# Patient Record
Sex: Female | Born: 1960
Health system: Southern US, Community
[De-identification: ages and names within clinical notes are randomized; demographics above are authoritative.]

## PROBLEM LIST (undated history)

## (undated) DIAGNOSIS — K649 Unspecified hemorrhoids: Secondary | ICD-10-CM

## (undated) DIAGNOSIS — M1990 Unspecified osteoarthritis, unspecified site: Secondary | ICD-10-CM

## (undated) HISTORY — DX: Unspecified osteoarthritis, unspecified site: M19.90

## (undated) HISTORY — DX: Unspecified hemorrhoids: K64.9

## (undated) HISTORY — PX: BREAST BIOPSY: SHX20

---

## 1997-09-14 ENCOUNTER — Other Ambulatory Visit: Admission: RE | Admit: 1997-09-14 | Discharge: 1997-09-14 | Payer: Self-pay | Admitting: Obstetrics & Gynecology

## 1997-11-10 ENCOUNTER — Ambulatory Visit (HOSPITAL_COMMUNITY): Admission: RE | Admit: 1997-11-10 | Discharge: 1997-11-10 | Payer: Self-pay | Admitting: *Deleted

## 1998-03-31 ENCOUNTER — Inpatient Hospital Stay (HOSPITAL_COMMUNITY): Admission: AD | Admit: 1998-03-31 | Discharge: 1998-04-02 | Payer: Self-pay | Admitting: *Deleted

## 2001-08-05 ENCOUNTER — Encounter: Payer: Self-pay | Admitting: Family Medicine

## 2001-08-05 ENCOUNTER — Encounter: Admission: RE | Admit: 2001-08-05 | Discharge: 2001-08-05 | Payer: Self-pay | Admitting: Family Medicine

## 2004-04-13 ENCOUNTER — Other Ambulatory Visit: Admission: RE | Admit: 2004-04-13 | Discharge: 2004-04-13 | Payer: Self-pay | Admitting: Obstetrics and Gynecology

## 2004-04-14 ENCOUNTER — Ambulatory Visit (HOSPITAL_COMMUNITY): Admission: RE | Admit: 2004-04-14 | Discharge: 2004-04-14 | Payer: Self-pay | Admitting: Obstetrics and Gynecology

## 2004-04-25 ENCOUNTER — Encounter: Admission: RE | Admit: 2004-04-25 | Discharge: 2004-04-25 | Payer: Self-pay | Admitting: Obstetrics and Gynecology

## 2006-02-13 HISTORY — PX: OTHER SURGICAL HISTORY: SHX169

## 2006-12-24 ENCOUNTER — Encounter: Admission: RE | Admit: 2006-12-24 | Discharge: 2006-12-24 | Payer: Self-pay | Admitting: Obstetrics and Gynecology

## 2007-01-01 ENCOUNTER — Encounter: Admission: RE | Admit: 2007-01-01 | Discharge: 2007-01-01 | Payer: Self-pay | Admitting: Obstetrics and Gynecology

## 2007-01-09 ENCOUNTER — Encounter (INDEPENDENT_AMBULATORY_CARE_PROVIDER_SITE_OTHER): Payer: Self-pay | Admitting: Diagnostic Radiology

## 2007-01-09 ENCOUNTER — Encounter: Admission: RE | Admit: 2007-01-09 | Discharge: 2007-01-09 | Payer: Self-pay | Admitting: Obstetrics and Gynecology

## 2007-11-29 ENCOUNTER — Ambulatory Visit: Payer: Self-pay | Admitting: Internal Medicine

## 2007-12-11 ENCOUNTER — Ambulatory Visit: Payer: Self-pay | Admitting: Internal Medicine

## 2007-12-24 ENCOUNTER — Telehealth: Payer: Self-pay | Admitting: Internal Medicine

## 2007-12-24 LAB — CONVERTED CEMR LAB
Cholesterol: 160 mg/dL (ref 0–200)
LDL Cholesterol: 97 mg/dL (ref 0–99)
Total CHOL/HDL Ratio: 2.8
VLDL: 6 mg/dL (ref 0–40)

## 2007-12-27 ENCOUNTER — Encounter: Admission: RE | Admit: 2007-12-27 | Discharge: 2007-12-27 | Payer: Self-pay | Admitting: Obstetrics and Gynecology

## 2008-01-13 ENCOUNTER — Encounter: Admission: RE | Admit: 2008-01-13 | Discharge: 2008-01-13 | Payer: Self-pay | Admitting: Obstetrics and Gynecology

## 2008-10-20 ENCOUNTER — Encounter: Admission: RE | Admit: 2008-10-20 | Discharge: 2008-10-20 | Payer: Self-pay | Admitting: Obstetrics and Gynecology

## 2009-12-06 ENCOUNTER — Encounter: Payer: Self-pay | Admitting: Internal Medicine

## 2009-12-06 ENCOUNTER — Ambulatory Visit: Payer: Self-pay | Admitting: Family Medicine

## 2009-12-06 DIAGNOSIS — K648 Other hemorrhoids: Secondary | ICD-10-CM | POA: Insufficient documentation

## 2010-03-15 NOTE — Assessment & Plan Note (Signed)
Summary: external hemorroid//ccm   Vital Signs:  Patient profile:   50 year old female Height:      64 inches (162.56 cm) Weight:      131 pounds (59.55 kg) BMI:     22.57 O2 Sat:      98 % on Room air Temp:     98.5 degrees F (36.94 degrees C) oral Pulse rate:   90 / minute BP sitting:   120 / 78  (left arm) Cuff size:   regular  Vitals Entered By: Josph Macho RMA (December 06, 2009 2:43 PM)  O2 Flow:  Room air CC: External hemorroid/ painful X5 days/  CF Is Patient Diabetic? No   History of Present Illness: Patient is having trouble with an external hemorrhoid. Has had trouble off and on over the past year, has occasional scant rectal bleeding red on the tissue, resolves spontaneously and nonpainful. This time the hemorrhoid is external and nonreducible. Mildly painful, unable to sit comfortably. Is still moving her bowels daily, no diarrhea, no tarry stool. No abdominal pain/v/n/anorexia/f/c/CP/palp/SOB.  Current Problems (verified): 1)  Preventive Health Care  (ICD-V70.0)  Current Medications (verified): 1)  None  Allergies (verified): No Known Drug Allergies  Past History:  Past medical history reviewed for relevance to current acute and chronic problems. Social history (including risk factors) reviewed for relevance to current acute and chronic problems.  Past Medical History: Reviewed history from 11/29/2007 and no changes required. gravida 5, para 5 abortus zero  Social History: Reviewed history from 11/29/2007 and no changes required. Married Never Smoked  5 children, ages 64, through 20  Review of Systems      See HPI  Physical Exam  General:  Well-developed,well-nourished,in no acute distress; alert,appropriate and cooperative throughout examination Head:  Normocephalic and atraumatic without obvious abnormalities. No apparent alopecia or balding. Ears:  External ear exam shows no significant lesions or deformities.  Otoscopic examination reveals  clear canals, tympanic membranes are intact bilaterally without bulging, retraction, inflammation or discharge. Hearing is grossly normal bilaterally. Nose:  External nasal examination shows no deformity or inflammation. Nasal mucosa are pink and moist without lesions or exudates. Neck:  No deformities, masses, or tenderness noted. Lungs:  Normal respiratory effort, chest expands symmetrically. Lungs are clear to auscultation, no crackles or wheezes. Heart:  Normal rate and regular rhythm. S1 and S2 normal without gallop, murmur, click, rub or other extra sounds. Abdomen:  Bowel sounds positive,abdomen soft and non-tender without masses, organomegaly or hernias noted. Extremities:  No clubbing, cyanosis, edema, or deformity noted  Cervical Nodes:  No lymphadenopathy noted Psych:  Oriented X3 and dysphoric affect.     Impression & Recommendations:  Problem # 1:  INTERNAL HEMORRHOIDS WITHOUT MENTION COMP (ICD-455.0) Anusol HC suppository two times a day and referred to GI for colonoscopy due to her age and recurrence of hermorrhoids, Encouraged 64 oz of clear fluids and hi fiber diet. Suggested daily yogurt/probiotic  Complete Medication List: 1)  Anusol-hc 25 Mg Supp (Hydrocortisone acetate) .Marland Kitchen.. 1 supp pr two times a day x 5 days then as needed two times a day for hemorrhoids after that 2)  Lidocaine Hcl 2 % Gel (Lidocaine hcl) .... Apply small amount pr two times a day as needed pain  Other Orders: Gastroenterology Referral (GI)  Patient Instructions: 1)  Please schedule a follow-up appointment as needed if symptoms worsen or do not improve. 2)  Cleanse area with witch hazel astringent or Tucks medicated pads as needed bowel movements  3)  Use the Lidocaine sparingly for pain Prescriptions: LIDOCAINE HCL 2 % GEL (LIDOCAINE HCL) apply small amount pr two times a day as needed pain  #1 tube x 1   Entered and Authorized by:   Danise Edge MD   Signed by:   Danise Edge MD on 12/06/2009    Method used:   Electronically to        [facility] [phone]* (retail)       [address]       [place], [place]  [address]       Ph: [phone] or [phone]       Fax: [phone]   RxID:   [id number] ANUSOL-HC 25 MG SUPP (HYDROCORTISONE ACETATE) 1 supp pr two times a day x 5 days then as needed two times a day for hemorrhoids after that  #20 x 1   Entered and Authorized by:   Danise Edge MD   Signed by:   Danise Edge MD on 12/06/2009   Method used:   Electronically to        [facility] [phone]* (retail)       [address]       [place], [place]  [address]       Ph: [phone] or [phone]       Fax: [phone]   RxID:   [id number]    Orders Added: 1)  Gastroenterology Referral [GI] 2)  Est. Patient Level II [[address]]

## 2010-03-15 NOTE — Letter (Signed)
Summary: New Patient letter  [facility]  [address] [place], [place] [address]   Phone: [phone]  Fax: [phone]       12/06/2009 MRN: [id number]  ALIMA NASER [address] [place], [place]  [address]  Dear Ms. Eiland,  Welcome to the Gastroenterology Division at [facility].    You are scheduled to see Dr.  Marina Goodell on 01-12-10 at 3:30pm on the 3rd floor at [facility], [address]  We ask that you try to arrive at our office 15 minutes prior to your appointment time to allow for check-in.  We would like you to complete the enclosed self-administered evaluation form prior to your visit and bring it with you on the day of your appointment.  We will review it with you.  Also, please bring a complete list of all your medications or, if you prefer, bring the medication bottles and we will list them.  Please bring your insurance card so that we may make a copy of it.  If your insurance requires a referral to see a specialist, please bring your referral form from your primary care physician.  Co-payments are due at the time of your visit and may be paid by cash, check or credit card.     Your office visit will consist of a consult with your physician (includes a physical exam), any laboratory testing he/she may order, scheduling of any necessary diagnostic testing (e.g. x-ray, ultrasound, CT-scan), and scheduling of a procedure (e.g. Endoscopy, Colonoscopy) if required.  Please allow enough time on your schedule to allow for any/all of these possibilities.    If you cannot keep your appointment, please call [phone] to cancel or reschedule prior to your appointment date.  This allows [place] the opportunity to schedule an appointment for another patient in need of care.  If you do not cancel or reschedule by 5 p.m. the business day prior to your appointment date, you will be charged a $50.00 late cancellation/no-show fee.    Thank you for choosing  [facility] for your medical needs.  We appreciate the opportunity to care for you.  Please visit [place] at our website  to learn more about our practice.                     Sincerely,                                                             The Gastroenterology Division

## 2010-11-18 ENCOUNTER — Encounter: Payer: Self-pay | Admitting: Internal Medicine

## 2010-11-21 ENCOUNTER — Ambulatory Visit (INDEPENDENT_AMBULATORY_CARE_PROVIDER_SITE_OTHER): Payer: Self-pay | Admitting: Internal Medicine

## 2010-11-21 ENCOUNTER — Encounter: Payer: Self-pay | Admitting: Internal Medicine

## 2010-11-21 VITALS — BP 100/70 | HR 74 | Temp 98.1°F | Resp 16 | Ht 64.0 in | Wt 131.0 lb

## 2010-11-21 DIAGNOSIS — Z0000 Encounter for general adult medical examination without abnormal findings: Secondary | ICD-10-CM

## 2010-11-21 LAB — CBC WITH DIFFERENTIAL/PLATELET
Basophils Relative: 0.5 % (ref 0.0–3.0)
Hemoglobin: 13.8 g/dL (ref 12.0–15.0)
Lymphocytes Relative: 28.4 % (ref 12.0–46.0)
Lymphs Abs: 2 10*3/uL (ref 0.7–4.0)
MCV: 92.6 fl (ref 78.0–100.0)
Monocytes Absolute: 0.5 10*3/uL (ref 0.1–1.0)
Monocytes Relative: 6.5 % (ref 3.0–12.0)
Neutro Abs: 4.5 10*3/uL (ref 1.4–7.7)
Neutrophils Relative %: 64 % (ref 43.0–77.0)
RDW: 13.7 % (ref 11.5–14.6)

## 2010-11-21 LAB — COMPREHENSIVE METABOLIC PANEL
ALT: 18 U/L (ref 0–35)
Albumin: 4.3 g/dL (ref 3.5–5.2)
Alkaline Phosphatase: 38 U/L — ABNORMAL LOW (ref 39–117)
Chloride: 105 mEq/L (ref 96–112)
Creatinine, Ser: 0.8 mg/dL (ref 0.4–1.2)
GFR: 81.35 mL/min (ref >60.00)
Glucose, Bld: 84 mg/dL (ref 70–99)
Potassium: 4 mEq/L (ref 3.5–5.1)
Sodium: 141 mEq/L (ref 135–145)
Total Bilirubin: 0.9 mg/dL (ref 0.3–1.2)
Total Protein: 7.1 g/dL (ref 6.0–8.3)

## 2010-11-21 LAB — CK: Total CK: 62 U/L (ref 7–177)

## 2010-11-21 LAB — LIPID PANEL
HDL: 83.5 mg/dL (ref >39.00)
Total CHOL/HDL Ratio: 2
Triglycerides: 39 mg/dL (ref 0.0–149.0)

## 2010-11-21 NOTE — Progress Notes (Signed)
  Subjective:    Patient ID: Jessica Hickman, female    DOB: 05-Sep-1960, 50 y.o.   MRN: [id number]  HPI  50 year old patient who is in today for a health maintenance exam. She is followed by gynecology annually. She is quite well today. Her mother has seen neurology who has requested a CPK on all her siblings. Apparently there is some concern for muscular dystrophy    Current Allergies:   No known allergies   Past Medical History:   Reviewed history and no changes required:  gravida 5, para 5 abortus zero   Past Surgical History:  Reviewed history and no changes required:  breast biopsy for benign disease. 2008   Family History:  father died age 36, complications of coronary artery disease, status post aortic valve repair, history of type 2 diabetes  Other a 52, history of hypertension  One brother one sister both with obesity, diabetes, and hypertension   Social History:  Married  Never Smoked  5 children, ages 71, through 56   Risk Factors:  Tobacco use: never    Review of Systems  Constitutional: Negative for fever, appetite change, fatigue and unexpected weight change.  HENT: Negative for hearing loss, ear pain, nosebleeds, congestion, sore throat, mouth sores, trouble swallowing, neck stiffness, dental problem, voice change, sinus pressure and tinnitus.   Eyes: Negative for photophobia, pain, redness and visual disturbance.  Respiratory: Negative for cough, chest tightness and shortness of breath.   Cardiovascular: Negative for chest pain, palpitations and leg swelling.  Gastrointestinal: Negative for nausea, vomiting, abdominal pain, diarrhea, constipation, blood in stool, abdominal distention and rectal pain.  Genitourinary: Negative for dysuria, urgency, frequency, hematuria, flank pain, vaginal bleeding, vaginal discharge, difficulty urinating, genital sores, vaginal pain, menstrual problem and pelvic pain.  Musculoskeletal: Negative for back pain and arthralgias.    Skin: Negative for rash.  Neurological: Negative for dizziness, syncope, speech difficulty, weakness, light-headedness, numbness and headaches.  Hematological: Negative for adenopathy. Does not bruise/bleed easily.  Psychiatric/Behavioral: Negative for suicidal ideas, behavioral problems, self-injury, dysphoric mood and agitation. The patient is not nervous/anxious.        Objective:   Physical Exam  Constitutional: She is oriented to person, place, and time. She appears well-developed and well-nourished.  HENT:  Head: Normocephalic and atraumatic.  Right Ear: External ear normal.  Left Ear: External ear normal.  Mouth/Throat: Oropharynx is clear and moist.  Eyes: Conjunctivae and EOM are normal.  Neck: Normal range of motion. Neck supple. No JVD present. No thyromegaly present.  Cardiovascular: Normal rate, regular rhythm, normal heart sounds and intact distal pulses.   No murmur heard. Pulmonary/Chest: Effort normal and breath sounds normal. She has no wheezes. She has no rales.  Abdominal: Soft. Bowel sounds are normal. She exhibits no distension and no mass. There is no tenderness. There is no rebound and no guarding.  Musculoskeletal: Normal range of motion. She exhibits no edema and no tenderness.  Neurological: She is alert and oriented to person, place, and time. She has normal reflexes. No cranial nerve deficit. She exhibits normal muscle tone. Coordination normal.  Skin: Skin is warm and dry. No rash noted.  Psychiatric: She has a normal mood and affect. Her behavior is normal.          Assessment & Plan:   Preventive health examination. Which x-ray and lab including CPK follow gynecology annually. Return here in 2 years or as needed

## 2010-11-21 NOTE — Patient Instructions (Signed)
It is important that you exercise regularly, at least 20 minutes 3 to 4 times per week.  If you develop chest pain or shortness of breath seek  medical attention.  Take 400-600 mg of ibuprofen ( Advil, Motrin) with food every 4 to 6 hours as needed for pain relief or control of fever  Call or return to clinic prn if these symptoms worsen or fail to improve as anticipated.

## 2011-12-19 ENCOUNTER — Other Ambulatory Visit (INDEPENDENT_AMBULATORY_CARE_PROVIDER_SITE_OTHER): Payer: BC Managed Care – PPO

## 2011-12-19 DIAGNOSIS — Z0000 Encounter for general adult medical examination without abnormal findings: Secondary | ICD-10-CM

## 2011-12-19 LAB — POCT URINALYSIS DIPSTICK
Bilirubin, UA: NEGATIVE
Glucose, UA: NEGATIVE
Ketones, UA: NEGATIVE
Protein, UA: NEGATIVE
Spec Grav, UA: 1.02
Urobilinogen, UA: 0.2
pH, UA: 7

## 2011-12-19 LAB — BASIC METABOLIC PANEL
CO2: 26 mEq/L (ref 19–32)
Chloride: 104 mEq/L (ref 96–112)
Creatinine, Ser: 0.7 mg/dL (ref 0.4–1.2)
GFR: 99.37 mL/min (ref >60.00)
Glucose, Bld: 82 mg/dL (ref 70–99)
Potassium: 3.8 mEq/L (ref 3.5–5.1)

## 2011-12-19 LAB — CBC WITH DIFFERENTIAL/PLATELET
Basophils Absolute: 0 10*3/uL (ref 0.0–0.1)
Eosinophils Relative: 1.4 % (ref 0.0–5.0)
Lymphocytes Relative: 29 % (ref 12.0–46.0)
MCHC: 32.4 g/dL (ref 30.0–36.0)
RBC: 4.37 Mil/uL (ref 3.87–5.11)
RDW: 13.5 % (ref 11.5–14.6)

## 2011-12-19 LAB — LIPID PANEL
Cholesterol: 171 mg/dL (ref 0–200)
LDL Cholesterol: 100 mg/dL — ABNORMAL HIGH (ref 0–99)
Triglycerides: 45 mg/dL (ref 0.0–149.0)

## 2011-12-19 LAB — HEPATIC FUNCTION PANEL
ALT: 14 U/L (ref 0–35)
Total Bilirubin: 0.7 mg/dL (ref 0.3–1.2)
Total Protein: 6.6 g/dL (ref 6.0–8.3)

## 2011-12-26 ENCOUNTER — Encounter: Payer: Self-pay | Admitting: Internal Medicine

## 2011-12-26 ENCOUNTER — Ambulatory Visit (INDEPENDENT_AMBULATORY_CARE_PROVIDER_SITE_OTHER): Payer: BC Managed Care – PPO | Admitting: Internal Medicine

## 2011-12-26 VITALS — BP 104/70 | HR 68 | Temp 97.4°F | Resp 16 | Ht 64.0 in | Wt 131.0 lb

## 2011-12-26 DIAGNOSIS — Z0000 Encounter for general adult medical examination without abnormal findings: Secondary | ICD-10-CM

## 2011-12-26 NOTE — Progress Notes (Signed)
Subjective:    Patient ID: Jessica Hickman, female    DOB: October 20, 1960, 51 y.o.   MRN: [id number]  HPI  51 year old patient who is seen today for a health maintenance examination. She has enjoyed excellent health and has no concerns or complaints. She takes no chronic medications.  Current Allergies:  No known allergies   Past Medical History:  Reviewed history and no changes required:  gravida 5, para 5 abortus zero   Past Surgical History:  Reviewed history and no changes required:  breast biopsy for benign disease. 2008   Family History:  father died age 22, complications of coronary artery disease, status post aortic valve repair, history of type 2 diabetes  Other a 31, history of hypertension  One brother one sister both with obesity, diabetes, and hypertension   Social History:  Married  Never Smoked  5 children, ages 35, through 66   Risk Factors:   Tobacco use: never    No past medical history on file.  History   Social History  . Marital Status: Married    Spouse Name: N/A    Number of Children: N/A  . Years of Education: N/A   Occupational History  . Not on file.   Social History Main Topics  . Smoking status: Never Smoker   . Smokeless tobacco: Never Used  . Alcohol Use: Yes  . Drug Use: No  . Sexually Active: Not on file   Other Topics Concern  . Not on file   Social History Narrative  . No narrative on file    Past Surgical History  Procedure Date  . Breast biopsy for benign disease 2008    No family history on file.  No Known Allergies  Current Outpatient Prescriptions on File Prior to Visit  Medication Sig Dispense Refill  . hydrocortisone (ANUSOL-HC) 25 MG suppository Place 25 mg rectally 2 (two) times daily.        Marland Kitchen lidocaine (XYLOCAINE) 2 % jelly Apply 1 application topically 2 (two) times daily.          BP 104/70  Pulse 68  Temp 97.4 F (36.3 C) (Oral)  Resp 16  Ht 5\' 4"  (1.626 m)  Wt 131 lb (59.421 kg)  BMI 22.49  kg/m2  LMP 12/12/2011       Review of Systems  Constitutional: Negative for fever, appetite change, fatigue and unexpected weight change.  HENT: Negative for hearing loss, ear pain, nosebleeds, congestion, sore throat, mouth sores, trouble swallowing, neck stiffness, dental problem, voice change, sinus pressure and tinnitus.   Eyes: Negative for photophobia, pain, redness and visual disturbance.  Respiratory: Negative for cough, chest tightness and shortness of breath.   Cardiovascular: Negative for chest pain, palpitations and leg swelling.  Gastrointestinal: Negative for nausea, vomiting, abdominal pain, diarrhea, constipation, blood in stool, abdominal distention and rectal pain.  Genitourinary: Negative for dysuria, urgency, frequency, hematuria, flank pain, vaginal bleeding, vaginal discharge, difficulty urinating, genital sores, vaginal pain, menstrual problem and pelvic pain.  Musculoskeletal: Negative for back pain and arthralgias.  Skin: Negative for rash.  Neurological: Negative for dizziness, syncope, speech difficulty, weakness, light-headedness, numbness and headaches.  Hematological: Negative for adenopathy. Does not bruise/bleed easily.  Psychiatric/Behavioral: Negative for suicidal ideas, behavioral problems, self-injury, dysphoric mood and agitation. The patient is not nervous/anxious.        Objective:   Physical Exam  Constitutional: She is oriented to person, place, and time. She appears well-developed and well-nourished.  HENT:  Head: Normocephalic and atraumatic.  Right Ear: External ear normal.  Left Ear: External ear normal.  Mouth/Throat: Oropharynx is clear and moist.  Eyes: Conjunctivae normal and EOM are normal.  Neck: Normal range of motion. Neck supple. No JVD present. No thyromegaly present.  Cardiovascular: Normal rate, regular rhythm, normal heart sounds and intact distal pulses.   No murmur heard. Pulmonary/Chest: Effort normal and breath sounds  normal. She has no wheezes. She has no rales.  Abdominal: Soft. Bowel sounds are normal. She exhibits no distension and no mass. There is no tenderness. There is no rebound and no guarding.  Musculoskeletal: Normal range of motion. She exhibits no edema and no tenderness.  Neurological: She is alert and oriented to person, place, and time. She has normal reflexes. No cranial nerve deficit. She exhibits normal muscle tone. Coordination normal.  Skin: Skin is warm and dry. No rash noted.  Psychiatric: She has a normal mood and affect. Her behavior is normal.          Assessment & Plan:    Preventive health examination  Continue active lifestyle Recheck 1 year or as needed

## 2011-12-26 NOTE — Patient Instructions (Signed)
It is important that you exercise regularly, at least 20 minutes 3 to 4 times per week.  If you develop chest pain or shortness of breath seek  medical attention.  Return in one year for follow-up   

## 2012-05-21 ENCOUNTER — Ambulatory Visit (INDEPENDENT_AMBULATORY_CARE_PROVIDER_SITE_OTHER): Payer: BC Managed Care – PPO | Admitting: Internal Medicine

## 2012-05-21 ENCOUNTER — Encounter: Payer: Self-pay | Admitting: Internal Medicine

## 2012-05-21 VITALS — BP 110/70 | HR 102 | Temp 101.9°F | Resp 18 | Wt 133.0 lb

## 2012-05-21 DIAGNOSIS — R509 Fever, unspecified: Secondary | ICD-10-CM

## 2012-05-21 DIAGNOSIS — R599 Enlarged lymph nodes, unspecified: Secondary | ICD-10-CM

## 2012-05-21 NOTE — Patient Instructions (Signed)
Take [phone] mg of Tylenol every 6 hours as needed for pain relief or fever.  Avoid taking more than 3000 mg in a 24-hour period (  This may cause liver damage).  Drink as much fluid as you  can tolerate over the next few days  Take 400-600 mg of ibuprofen ( Advil, Motrin) with food every 4 to 6 hours as needed for pain relief or control of fever

## 2012-05-21 NOTE — Progress Notes (Signed)
Subjective:    Patient ID: Jessica Hickman, female    DOB: 05/08/1960, 52 y.o.   MRN: [id number]  HPI  52 year old patient who presents with a four-day history of fever myalgias anterior cervical tenderness and just a general sense of unwellness. There's been no cough fever and body aches are her predominant symptoms. Denies any sore throat or strep exposure. No history of tick exposure or rash. Denies any headache  History reviewed. No pertinent past medical history.  History   Social History  . Marital Status: Married    Spouse Name: N/A    Number of Children: N/A  . Years of Education: N/A   Occupational History  . Not on file.   Social History Main Topics  . Smoking status: Never Smoker   . Smokeless tobacco: Never Used  . Alcohol Use: Yes  . Drug Use: No  . Sexually Active: Not on file   Other Topics Concern  . Not on file   Social History Narrative  . No narrative on file    Past Surgical History  Procedure Laterality Date  . Breast biopsy for benign disease  2008    No family history on file.  No Known Allergies  Current Outpatient Prescriptions on File Prior to Visit  Medication Sig Dispense Refill  . hydrocortisone (ANUSOL-HC) 25 MG suppository Place 25 mg rectally 2 (two) times daily.        Marland Kitchen lidocaine (XYLOCAINE) 2 % jelly Apply 1 application topically 2 (two) times daily.         No current facility-administered medications on file prior to visit.    BP 110/70  Pulse 102  Temp(Src) 101.9 F (38.8 C) (Oral)  Resp 18  Wt 133 lb (60.328 kg)  BMI 22.82 kg/m2  SpO2 97%  LMP 02/14/2012      Review of Systems  Constitutional: Positive for fever, chills, activity change, appetite change and fatigue.  HENT: Negative for hearing loss, congestion, sore throat, rhinorrhea, dental problem, sinus pressure and tinnitus.   Eyes: Negative for pain, discharge and visual disturbance.  Respiratory: Negative for cough and shortness of breath.    Cardiovascular: Negative for chest pain, palpitations and leg swelling.  Gastrointestinal: Negative for nausea, vomiting, abdominal pain, diarrhea, constipation, blood in stool and abdominal distention.  Genitourinary: Negative for dysuria, urgency, frequency, hematuria, flank pain, vaginal bleeding, vaginal discharge, difficulty urinating, vaginal pain and pelvic pain.  Musculoskeletal: Negative for joint swelling, arthralgias and gait problem.  Skin: Negative for rash.  Neurological: Negative for dizziness, syncope, speech difficulty, weakness, numbness and headaches.  Hematological: Negative for adenopathy.  Psychiatric/Behavioral: Negative for behavioral problems, dysphoric mood and agitation. The patient is not nervous/anxious.        Objective:   Physical Exam  Constitutional: She is oriented to person, place, and time. She appears well-developed and well-nourished. No distress.  Clinically appears well Temperature 101.9  HENT:  Head: Normocephalic.  Right Ear: External ear normal.  Left Ear: External ear normal.  Mouth/Throat: Oropharynx is clear and moist.  Eyes: Conjunctivae and EOM are normal. Pupils are equal, round, and reactive to light.  Neck: Normal range of motion. Neck supple. No thyromegaly present.  Mild tenderness in the submandibular region but no definite adenopathy  Cardiovascular: Normal rate, regular rhythm, normal heart sounds and intact distal pulses.   Pulmonary/Chest: Effort normal and breath sounds normal.  Abdominal: Soft. Bowel sounds are normal. She exhibits no mass. There is no tenderness. There is no rebound and no guarding.  Musculoskeletal: Normal range of motion.  Lymphadenopathy:    She has no cervical adenopathy.  Neurological: She is alert and oriented to person, place, and time.  Skin: Skin is warm and dry. No rash noted.  Psychiatric: She has a normal mood and affect. Her behavior is normal.          Assessment & Plan:   Acute  febrile illness. Neg rapid strep  Will treat with tylenol and advil

## 2012-06-19 ENCOUNTER — Other Ambulatory Visit: Payer: Self-pay | Admitting: Obstetrics and Gynecology

## 2012-06-19 DIAGNOSIS — Z1231 Encounter for screening mammogram for malignant neoplasm of breast: Secondary | ICD-10-CM

## 2012-07-23 ENCOUNTER — Ambulatory Visit: Payer: BC Managed Care – PPO

## 2012-09-25 ENCOUNTER — Ambulatory Visit
Admission: RE | Admit: 2012-09-25 | Discharge: 2012-09-25 | Disposition: A | Discharge status: Home / Self Care | Payer: BC Managed Care – PPO | Source: Ambulatory Visit | Attending: Obstetrics and Gynecology | Admitting: Obstetrics and Gynecology

## 2012-09-25 DIAGNOSIS — Z1231 Encounter for screening mammogram for malignant neoplasm of breast: Secondary | ICD-10-CM

## 2012-12-04 ENCOUNTER — Telehealth: Payer: Self-pay | Admitting: Internal Medicine

## 2012-12-04 NOTE — Telephone Encounter (Signed)
Pt has been sch for cpx °

## 2012-12-04 NOTE — Telephone Encounter (Signed)
Pt just want cpx labs not physical. Can I sch?

## 2012-12-04 NOTE — Telephone Encounter (Signed)
Pt needs CPX can not order labs without physical.

## 2012-12-17 ENCOUNTER — Other Ambulatory Visit: Payer: BC Managed Care – PPO

## 2012-12-17 ENCOUNTER — Other Ambulatory Visit (INDEPENDENT_AMBULATORY_CARE_PROVIDER_SITE_OTHER): Payer: BC Managed Care – PPO

## 2012-12-17 DIAGNOSIS — Z0000 Encounter for general adult medical examination without abnormal findings: Secondary | ICD-10-CM

## 2012-12-17 LAB — POCT URINALYSIS DIPSTICK
Bilirubin, UA: NEGATIVE
Ketones, UA: NEGATIVE
Spec Grav, UA: <=1.005

## 2012-12-17 LAB — LIPID PANEL: Triglycerides: 40 mg/dL (ref 0.0–149.0)

## 2012-12-17 LAB — CBC WITH DIFFERENTIAL/PLATELET
Basophils Absolute: 0 10*3/uL (ref 0.0–0.1)
Basophils Relative: 0.8 % (ref 0.0–3.0)
Eosinophils Relative: 1.6 % (ref 0.0–5.0)
Hemoglobin: 13.7 g/dL (ref 12.0–15.0)
Lymphocytes Relative: 35.4 % (ref 12.0–46.0)
MCHC: 33.7 g/dL (ref 30.0–36.0)
MCV: 90.1 fl (ref 78.0–100.0)
Monocytes Relative: 6.8 % (ref 3.0–12.0)
Neutrophils Relative %: 55.4 % (ref 43.0–77.0)
RBC: 4.5 Mil/uL (ref 3.87–5.11)
WBC: 5.9 10*3/uL (ref 4.5–10.5)

## 2012-12-17 LAB — HEPATIC FUNCTION PANEL
AST: 24 U/L (ref 0–37)
Albumin: 4.3 g/dL (ref 3.5–5.2)
Alkaline Phosphatase: 40 U/L (ref 39–117)
Total Protein: 6.9 g/dL (ref 6.0–8.3)

## 2012-12-17 LAB — BASIC METABOLIC PANEL: Calcium: 9.4 mg/dL (ref 8.4–10.5)

## 2012-12-19 ENCOUNTER — Other Ambulatory Visit: Payer: BC Managed Care – PPO

## 2013-01-20 ENCOUNTER — Encounter: Payer: Self-pay | Admitting: Internal Medicine

## 2013-01-20 ENCOUNTER — Ambulatory Visit (INDEPENDENT_AMBULATORY_CARE_PROVIDER_SITE_OTHER): Payer: BC Managed Care – PPO | Admitting: Internal Medicine

## 2013-01-20 VITALS — BP 110/70 | HR 70 | Temp 97.8°F | Resp 20 | Ht 63.75 in | Wt 140.0 lb

## 2013-01-20 DIAGNOSIS — Z0000 Encounter for general adult medical examination without abnormal findings: Secondary | ICD-10-CM

## 2013-01-20 NOTE — Patient Instructions (Addendum)
It is important that you exercise regularly, at least 20 minutes 3 to 4 times per week.  If you develop chest pain or shortness of breath seek  medical attention.  Schedule your colonoscopy to help detect colon cancer.  Return in one year for follow-up Health Maintenance, Female A healthy lifestyle and preventative care can promote health and wellness.  Maintain regular health, dental, and eye exams.  Eat a healthy diet. Foods like vegetables, fruits, whole grains, low-fat dairy products, and lean protein foods contain the nutrients you need without too many calories. Decrease your intake of foods high in solid fats, added sugars, and salt. Get information about a proper diet from your caregiver, if necessary.  Regular physical exercise is one of the most important things you can do for your health. Most adults should get at least 150 minutes of moderate-intensity exercise (any activity that increases your heart rate and causes you to sweat) each week. In addition, most adults need muscle-strengthening exercises on 2 or more days a week.   Maintain a healthy weight. The body mass index (BMI) is a screening tool to identify possible weight problems. It provides an estimate of body fat based on height and weight. Your caregiver can help determine your BMI, and can help you achieve or maintain a healthy weight. For adults 20 years and older:  A BMI below 18.5 is considered underweight.  A BMI of 18.5 to 24.9 is normal.  A BMI of 25 to 29.9 is considered overweight.  A BMI of 30 and above is considered obese.  Maintain normal blood lipids and cholesterol by exercising and minimizing your intake of saturated fat. Eat a balanced diet with plenty of fruits and vegetables. Blood tests for lipids and cholesterol should begin at age 30 and be repeated every 5 years. If your lipid or cholesterol levels are high, you are over 50, or you are a high risk for heart disease, you may need your cholesterol  levels checked more frequently.Ongoing high lipid and cholesterol levels should be treated with medicines if diet and exercise are not effective.  If you smoke, find out from your caregiver how to quit. If you do not use tobacco, do not start.  Lung cancer screening is recommended for adults aged 58 80 years who are at high risk for developing lung cancer because of a history of smoking. Yearly low-dose computed tomography (CT) is recommended for people who have at least a 30-pack-year history of smoking and are a current smoker or have quit within the past 15 years. A pack year of smoking is smoking an average of 1 pack of cigarettes a day for 1 year (for example: 1 pack a day for 30 years or 2 packs a day for 15 years). Yearly screening should continue until the smoker has stopped smoking for at least 15 years. Yearly screening should also be stopped for people who develop a health problem that would prevent them from having lung cancer treatment.  If you are pregnant, do not drink alcohol. If you are breastfeeding, be very cautious about drinking alcohol. If you are not pregnant and choose to drink alcohol, do not exceed 1 drink per day. One drink is considered to be 12 ounces (355 mL) of beer, 5 ounces (148 mL) of wine, or 1.5 ounces (44 mL) of liquor.  Avoid use of street drugs. Do not share needles with anyone. Ask for help if you need support or instructions about stopping the use of drugs.  High blood pressure causes heart disease and increases the risk of stroke. Blood pressure should be checked at least every 1 to 2 years. Ongoing high blood pressure should be treated with medicines, if weight loss and exercise are not effective.  If you are 7 to 50 years old, ask your caregiver if you should take aspirin to prevent strokes.  Diabetes screening involves taking a blood sample to check your fasting blood sugar level. This should be done once every 3 years, after age 36, if you are within  normal weight and without risk factors for diabetes. Testing should be considered at a younger age or be carried out more frequently if you are overweight and have at least 1 risk factor for diabetes.  Breast cancer screening is essential preventative care for women. You should practice "breast self-awareness." This means understanding the normal appearance and feel of your breasts and may include breast self-examination. Any changes detected, no matter how small, should be reported to a caregiver. Women in their 7s and 30s should have a clinical breast exam (CBE) by a caregiver as part of a regular health exam every 1 to 3 years. After age 55, women should have a CBE every year. Starting at age 62, women should consider having a mammogram (breast X-ray) every year. Women who have a family history of breast cancer should talk to their caregiver about genetic screening. Women at a high risk of breast cancer should talk to their caregiver about having an MRI and a mammogram every year.  Breast cancer gene (BRCA)-related cancer risk assessment is recommended for women who have family members with BRCA-related cancers. BRCA-related cancers include breast, ovarian, tubal, and peritoneal cancers. Having family members with these cancers may be associated with an increased risk for harmful changes (mutations) in the breast cancer genes BRCA1 and BRCA2. Results of the assessment will determine the need for genetic counseling and BRCA1 and BRCA2 testing.  The Pap test is a screening test for cervical cancer. Women should have a Pap test starting at age 47. Between ages 42 and 21, Pap tests should be repeated every 2 years. Beginning at age 63, you should have a Pap test every 3 years as long as the past 3 Pap tests have been normal. If you had a hysterectomy for a problem that was not cancer or a condition that could lead to cancer, then you no longer need Pap tests. If you are between ages 65 and 9, and you have had  normal Pap tests going back 10 years, you no longer need Pap tests. If you have had past treatment for cervical cancer or a condition that could lead to cancer, you need Pap tests and screening for cancer for at least 20 years after your treatment. If Pap tests have been discontinued, risk factors (such as a new sexual partner) need to be reassessed to determine if screening should be resumed. Some women have medical problems that increase the chance of getting cervical cancer. In these cases, your caregiver may recommend more frequent screening and Pap tests.  The human papillomavirus (HPV) test is an additional test that may be used for cervical cancer screening. The HPV test looks for the virus that can cause the cell changes on the cervix. The cells collected during the Pap test can be tested for HPV. The HPV test could be used to screen women aged 65 years and older, and should be used in women of any age who have unclear Pap test  results. After the age of 58, women should have HPV testing at the same frequency as a Pap test.  Colorectal cancer can be detected and often prevented. Most routine colorectal cancer screening begins at the age of 67 and continues through age 38. However, your caregiver may recommend screening at an earlier age if you have risk factors for colon cancer. On a yearly basis, your caregiver may provide home test kits to check for hidden blood in the stool. Use of a small camera at the end of a tube, to directly examine the colon (sigmoidoscopy or colonoscopy), can detect the earliest forms of colorectal cancer. Talk to your caregiver about this at age 36, when routine screening begins. Direct examination of the colon should be repeated every 5 to 10 years through age 4, unless early forms of pre-cancerous polyps or small growths are found.  Hepatitis C blood testing is recommended for all people born from [birth year] through 1965 and any individual with known risks for hepatitis  C.  Practice safe sex. Use condoms and avoid high-risk sexual practices to reduce the spread of sexually transmitted infections (STIs). Sexually active women aged 63 and younger should be checked for Chlamydia, which is a common sexually transmitted infection. Older women with new or multiple partners should also be tested for Chlamydia. Testing for other STIs is recommended if you are sexually active and at increased risk.  Osteoporosis is a disease in which the bones lose minerals and strength with aging. This can result in serious bone fractures. The risk of osteoporosis can be identified using a bone density scan. Women ages 16 and over and women at risk for fractures or osteoporosis should discuss screening with their caregivers. Ask your caregiver whether you should be taking a calcium supplement or vitamin D to reduce the rate of osteoporosis.  Menopause can be associated with physical symptoms and risks. Hormone replacement therapy is available to decrease symptoms and risks. You should talk to your caregiver about whether hormone replacement therapy is right for you.  Use sunscreen. Apply sunscreen liberally and repeatedly throughout the day. You should seek shade when your shadow is shorter than you. Protect yourself by wearing long sleeves, pants, a wide-brimmed hat, and sunglasses year round, whenever you are outdoors.  Notify your caregiver of new moles or changes in moles, especially if there is a change in shape or color. Also notify your caregiver if a mole is larger than the size of a pencil eraser.  Stay current with your immunizations. Document Released: 08/15/2010 Document Revised: 05/27/2012 Document Reviewed: 08/15/2010 [facility] Patient Information 2014 [place].

## 2013-01-20 NOTE — Progress Notes (Signed)
Pre-visit discussion using our clinic review tool. No additional management support is needed unless otherwise documented below in the visit note.  

## 2013-01-20 NOTE — Progress Notes (Signed)
Subjective:    Patient ID: Jessica Hickman, female    DOB: Aug 08, 1960, 52 y.o.   MRN: [id number]  HPI  Subjective:    Patient ID: Jessica Hickman, female    DOB: 1961-02-03, 52 y.o.   MRN: [id number]  HPI  52 -year-old patient who is seen today for a health maintenance examination. She has enjoyed excellent health and has no concerns or complaints. She takes no chronic medications. She has been followed closely by orthopedics due to chronic right hip pain. She has been referred to [facility] for further evaluation.  Current Allergies:  No known allergies   Past Medical History:   gravida 5, para 5 abortus zero   Past Surgical History:  Reviewed history and no changes required:  breast biopsy for benign disease. 2008   Family History:  father died age 70, complications of coronary artery disease, status post aortic valve repair, history of type 2 diabetes  Mother age 44,  history of hypertension  One brother one sister both with obesity, diabetes, and hypertension   Social History:  Married  Never Smoked  5 children, ages 27, through 68   Risk Factors:   Tobacco use: never    History reviewed. No pertinent past medical history.  History   Social History  . Marital Status: Married    Spouse Name: N/A    Number of Children: N/A  . Years of Education: N/A   Occupational History  . Not on file.   Social History Main Topics  . Smoking status: Never Smoker   . Smokeless tobacco: Never Used  . Alcohol Use: Yes  . Drug Use: No  . Sexual Activity: Not on file   Other Topics Concern  . Not on file   Social History Narrative  . No narrative on file    Past Surgical History  Procedure Laterality Date  . Breast biopsy for benign disease  2008    No family history on file.  No Known Allergies  Current Outpatient Prescriptions on File Prior to Visit  Medication Sig Dispense Refill  . hydrocortisone (ANUSOL-HC) 25 MG suppository Place 25 mg rectally 2 (two)  times daily as needed.       Marland Kitchen ibuprofen (ADVIL,MOTRIN) 200 MG tablet Take 400 mg by mouth every 6 (six) hours as needed for pain.      Marland Kitchen lidocaine (XYLOCAINE) 2 % jelly Apply 1 application topically 2 (two) times daily as needed.        No current facility-administered medications on file prior to visit.    BP 110/70  Pulse 70  Temp(Src) 97.8 F (36.6 C) (Oral)  Resp 20  Ht 5' 3.75" (1.619 m)  Wt 140 lb (63.504 kg)  BMI 24.23 kg/m2  SpO2 98%  LMP 02/14/2012       Review of Systems  Constitutional: Negative for fever, appetite change, fatigue and unexpected weight change.  HENT: Negative for hearing loss, ear pain, nosebleeds, congestion, sore throat, mouth sores, trouble swallowing, neck stiffness, dental problem, voice change, sinus pressure and tinnitus.   Eyes: Negative for photophobia, pain, redness and visual disturbance.  Respiratory: Negative for cough, chest tightness and shortness of breath.   Cardiovascular: Negative for chest pain, palpitations and leg swelling.  Gastrointestinal: Negative for nausea, vomiting, abdominal pain, diarrhea, constipation, blood in stool, abdominal distention and rectal pain.  Genitourinary: Negative for dysuria, urgency, frequency, hematuria, flank pain, vaginal bleeding, vaginal discharge, difficulty urinating, genital sores, vaginal pain, menstrual problem and pelvic pain.  Musculoskeletal: Negative for back pain and arthralgias.  Skin: Negative for rash.  Neurological: Negative for dizziness, syncope, speech difficulty, weakness, light-headedness, numbness and headaches.  Hematological: Negative for adenopathy. Does not bruise/bleed easily.  Psychiatric/Behavioral: Negative for suicidal ideas, behavioral problems, self-injury, dysphoric mood and agitation. The patient is not nervous/anxious.        Objective:   Physical Exam  Constitutional: She is oriented to person, place, and time. She appears well-developed and well-nourished.    HENT:  Head: Normocephalic and atraumatic.  Right Ear: External ear normal.  Left Ear: External ear normal.  Mouth/Throat: Oropharynx is clear and moist.  Eyes: Conjunctivae normal and EOM are normal.  Neck: Normal range of motion. Neck supple. No JVD present. No thyromegaly present.  Cardiovascular: Normal rate, regular rhythm, normal heart sounds and intact distal pulses.   No murmur heard. Pulmonary/Chest: Effort normal and breath sounds normal. She has no wheezes. She has no rales.  Abdominal: Soft. Bowel sounds are normal. She exhibits no distension and no mass. There is no tenderness. There is no rebound and no guarding.  Musculoskeletal: Normal range of motion. She exhibits no edema and no tenderness.  Neurological: She is alert and oriented to person, place, and time. She has normal reflexes. No cranial nerve deficit. She exhibits normal muscle tone. Coordination normal.  Skin: Skin is warm and dry. No rash noted.  Psychiatric: She has a normal mood and affect. Her behavior is normal.          Assessment & Plan:    Preventive health examination  Continue active lifestyle Recheck 1 year or as needed  Wt Readings from Last 3 Encounters:  01/20/13 140 lb (63.504 kg)  05/21/12 133 lb (60.328 kg)  12/26/11 131 lb (59.421 kg)    Review of Systems    as above. Unremarkable except for chronic right hip pain Objective:   Physical Exam  As above Full range of motion both hips      Assessment & Plan:  Preventive health exam Screening colonoscopy  Recheck one year

## 2013-03-25 ENCOUNTER — Encounter: Payer: Self-pay | Admitting: Internal Medicine

## 2013-07-29 ENCOUNTER — Telehealth: Payer: Self-pay | Admitting: Internal Medicine

## 2013-07-29 NOTE — Telephone Encounter (Signed)
Pt would like dr k to accept her son as new pt. Can I sch?

## 2013-07-29 NOTE — Telephone Encounter (Signed)
ok 

## 2013-07-30 NOTE — Telephone Encounter (Signed)
Pt son has been sch °

## 2013-12-15 ENCOUNTER — Encounter: Payer: Self-pay | Admitting: Internal Medicine

## 2013-12-18 ENCOUNTER — Other Ambulatory Visit (INDEPENDENT_AMBULATORY_CARE_PROVIDER_SITE_OTHER): Payer: BC Managed Care – PPO

## 2013-12-18 DIAGNOSIS — Z0000 Encounter for general adult medical examination without abnormal findings: Secondary | ICD-10-CM

## 2013-12-18 LAB — CBC WITH DIFFERENTIAL/PLATELET
Basophils Absolute: 0.1 10*3/uL (ref 0.0–0.1)
Basophils Relative: 0.8 % (ref 0.0–3.0)
EOS ABS: 0.1 10*3/uL (ref 0.0–0.7)
Eosinophils Relative: 0.8 % (ref 0.0–5.0)
HCT: 41.5 % (ref 36.0–46.0)
Hemoglobin: 13.8 g/dL (ref 12.0–15.0)
Lymphocytes Relative: 37.5 % (ref 12.0–46.0)
Lymphs Abs: 2.4 10*3/uL (ref 0.7–4.0)
MCHC: 33.1 g/dL (ref 30.0–36.0)
MCV: 88.6 fl (ref 78.0–100.0)
MONO ABS: 0.4 10*3/uL (ref 0.1–1.0)
Monocytes Relative: 6.2 % (ref 3.0–12.0)
Neutro Abs: 3.5 10*3/uL (ref 1.4–7.7)
Neutrophils Relative %: 54.7 % (ref 43.0–77.0)
Platelets: 244 10*3/uL (ref 150.0–400.0)
RBC: 4.69 Mil/uL (ref 3.87–5.11)
RDW: 13.5 % (ref 11.5–15.5)
WBC: 6.3 10*3/uL (ref 4.0–10.5)

## 2013-12-18 LAB — BASIC METABOLIC PANEL
BUN: 15 mg/dL (ref 6–23)
CALCIUM: 9.5 mg/dL (ref 8.4–10.5)
CO2: 29 meq/L (ref 19–32)
CREATININE: 0.7 mg/dL (ref 0.4–1.2)
Chloride: 104 mEq/L (ref 96–112)
GFR: 100.3 mL/min (ref >60.00)
Glucose, Bld: 80 mg/dL (ref 70–99)
Potassium: 3.9 mEq/L (ref 3.5–5.1)
SODIUM: 141 meq/L (ref 135–145)

## 2013-12-18 LAB — POCT URINALYSIS DIPSTICK
Bilirubin, UA: NEGATIVE
GLUCOSE UA: NEGATIVE
Ketones, UA: NEGATIVE
Nitrite, UA: NEGATIVE
PH UA: 7
Protein, UA: NEGATIVE
SPEC GRAV UA: 1.01
Urobilinogen, UA: 0.2

## 2013-12-18 LAB — HEPATIC FUNCTION PANEL
ALBUMIN: 3.8 g/dL (ref 3.5–5.2)
ALT: 18 U/L (ref 0–35)
AST: 21 U/L (ref 0–37)
Alkaline Phosphatase: 48 U/L (ref 39–117)
BILIRUBIN DIRECT: 0 mg/dL (ref 0.0–0.3)
Total Bilirubin: 0.6 mg/dL (ref 0.2–1.2)
Total Protein: 6.8 g/dL (ref 6.0–8.3)

## 2013-12-18 LAB — LIPID PANEL
CHOL/HDL RATIO: 3
CHOLESTEROL: 225 mg/dL — AB (ref 0–200)
HDL: 64.3 mg/dL (ref >39.00)
LDL CALC: 148 mg/dL — AB (ref 0–99)
NONHDL: 160.7
TRIGLYCERIDES: 66 mg/dL (ref 0.0–149.0)
VLDL: 13.2 mg/dL (ref 0.0–40.0)

## 2013-12-18 LAB — TSH: TSH: 2.62 u[IU]/mL (ref 0.35–4.50)

## 2014-01-12 ENCOUNTER — Other Ambulatory Visit: Payer: BC Managed Care – PPO

## 2014-02-04 ENCOUNTER — Ambulatory Visit (INDEPENDENT_AMBULATORY_CARE_PROVIDER_SITE_OTHER): Payer: BC Managed Care – PPO | Admitting: Internal Medicine

## 2014-02-04 ENCOUNTER — Encounter: Payer: Self-pay | Admitting: Internal Medicine

## 2014-02-04 VITALS — BP 122/76 | HR 76 | Temp 98.0°F | Resp 20 | Ht 64.5 in | Wt 147.0 lb

## 2014-02-04 DIAGNOSIS — Z0000 Encounter for general adult medical examination without abnormal findings: Secondary | ICD-10-CM

## 2014-02-04 MED ORDER — LIDOCAINE HCL 2 % EX GEL: 1.0000 "application " | Freq: Two times a day (BID) | CUTANEOUS | Status: DC | PRN

## 2014-02-04 MED ORDER — HYDROCORTISONE ACETATE 25 MG RE SUPP: 25.0000 mg | Freq: Two times a day (BID) | RECTAL | Status: DC | PRN

## 2014-02-04 NOTE — Progress Notes (Signed)
Subjective:    Patient ID: Jessica Hickman, female    DOB: 1960-04-15, 53 y.o.   MRN: [id number]  HPI   Subjective:    Patient ID: Jessica Balllen Maloney, female    DOB: 1960-04-15, 53 y.o.   MRN: [id number]  HPI  53  -year-old patient who is seen today for a health maintenance examination.  She has enjoyed excellent health and has no concerns or complaints. She takes no chronic medications. She has been followed closely by orthopedics due to chronic right hip pain. She has been [facility] to [facility].  She has early degenerative joint disease which has been fairly stable.  She does require periodic cortisone injections with some benefit.  She is scheduled for GYN follow-up next month.  She is agreeable for her first colonoscopy  Wt Readings from Last 3 Encounters:  02/04/14 147 lb (66.679 kg)  01/20/13 140 lb (63.504 kg)  05/21/12 133 lb (60.328 kg)    Current Allergies:  No known allergies   Past Medical History:   gravida 5, para 5 abortus zero   Past Surgical History:  Reviewed history and no changes required:  breast biopsy for benign disease. 2008   Family History:  father died age 51, complications of coronary artery disease, status post aortic valve repair, history of type 2 diabetes  Mother age 51,  history of hypertension and DJD One brother one sister both with obesity, diabetes, and hypertension   Social History:  Married  Never Smoked  5 children, ages 2514, through 224   Risk Factors:   Tobacco use: never    No past medical history on file.  History   Social History  . Marital Status: Married    Spouse Name: N/A    Number of Children: N/A  . Years of Education: N/A   Occupational History  . Not on file.   Social History Main Topics  . Smoking status: Never Smoker   . Smokeless tobacco: Never Used  . Alcohol Use: Yes  . Drug Use: No  . Sexual Activity: Not on file   Other Topics Concern  . Not on file   Social History  Narrative    Past Surgical History  Procedure Laterality Date  . Breast biopsy for benign disease  2008    No family history on file.  No Known Allergies  Current Outpatient Prescriptions on File Prior to Visit  Medication Sig Dispense Refill  . hydrocortisone (ANUSOL-HC) 25 MG suppository Place 25 mg rectally 2 (two) times daily as needed.     Marland Kitchen. ibuprofen (ADVIL,MOTRIN) 200 MG tablet Take 400 mg by mouth every 6 (six) hours as needed for pain.    Marland Kitchen. lidocaine (XYLOCAINE) 2 % jelly Apply 1 application topically 2 (two) times daily as needed.      No current facility-administered medications on file prior to visit.    BP 122/76 mmHg  Pulse 76  Temp(Src) 98 F (36.7 C) (Oral)  Resp 20  Ht 5' 4.5" (1.638 m)  Wt 147 lb (66.679 kg)  BMI 24.85 kg/m2  SpO2 97%  LMP 02/14/2012       Review of Systems  Constitutional: Negative for fever, appetite change, fatigue and unexpected weight change.  HENT: Negative for hearing loss, ear pain, nosebleeds, congestion, sore throat, mouth sores, trouble swallowing, neck stiffness, dental problem, voice change, sinus pressure and tinnitus.   Eyes: Negative for photophobia, pain, redness and visual disturbance.  Respiratory: Negative for cough, chest tightness and  shortness of breath.   Cardiovascular: Negative for chest pain, palpitations and leg swelling.  Gastrointestinal: Negative for nausea, vomiting, abdominal pain, diarrhea, constipation, blood in stool, abdominal distention and rectal pain.  Genitourinary: Negative for dysuria, urgency, frequency, hematuria, flank pain, vaginal bleeding, vaginal discharge, difficulty urinating, genital sores, vaginal pain, menstrual problem and pelvic pain.  Musculoskeletal: Negative for back pain and arthralgias.  Skin: Negative for rash.  Neurological: Negative for dizziness, syncope, speech difficulty, weakness, light-headedness, numbness and headaches.  Hematological: Negative for adenopathy. Does  not bruise/bleed easily.  Psychiatric/Behavioral: Negative for suicidal ideas, behavioral problems, self-injury, dysphoric mood and agitation. The patient is not nervous/anxious.        Objective:   Physical Exam  Constitutional: She is oriented to person, place, and time. She appears well-developed and well-nourished.  HENT:  Head: Normocephalic and atraumatic.  Right Ear: External ear normal.  Left Ear: External ear normal.  Mouth/Throat: Oropharynx is clear and moist.  Eyes: Conjunctivae normal and EOM are normal.  Neck: Normal range of motion. Neck supple. No JVD present. No thyromegaly present.  Cardiovascular: Normal rate, regular rhythm, normal heart sounds and intact distal pulses.   No murmur heard. Pulmonary/Chest: Effort normal and breath sounds normal. She has no wheezes. She has no rales.  Abdominal: Soft. Bowel sounds are normal. She exhibits no distension and no mass. There is no tenderness. There is no rebound and no guarding.  Musculoskeletal: Normal range of motion. She exhibits no edema and no tenderness.  Neurological: She is alert and oriented to person, place, and time. She has normal reflexes. No cranial nerve deficit. She exhibits normal muscle tone. Coordination normal.  Skin: Skin is warm and dry. No rash noted.  Psychiatric: She has a normal mood and affect. Her behavior is normal.          Assessment & Plan:    Preventive health examination  Continue active lifestyle Recheck 1 year or as needed  Wt Readings from Last 3 Encounters:  02/04/14 147 lb (66.679 kg)  01/20/13 140 lb (63.504 kg)  05/21/12 133 lb (60.328 kg)    Review of Systems     as above. Unremarkable except for chronic right hip pain Objective:   Physical Exam   As above Full range of motion both hips      Assessment & Plan:  Preventive health exam Screening colonoscopy  Recheck one year

## 2014-02-04 NOTE — Progress Notes (Signed)
Pre visit review using our clinic review tool, if applicable. No additional management support is needed unless otherwise documented below in the visit note. 

## 2014-02-04 NOTE — Patient Instructions (Signed)

## 2014-02-18 ENCOUNTER — Encounter: Payer: Self-pay | Admitting: Internal Medicine

## 2014-04-17 ENCOUNTER — Encounter: Payer: Self-pay | Admitting: Internal Medicine

## 2014-04-24 ENCOUNTER — Encounter: Payer: Self-pay | Admitting: Internal Medicine

## 2014-05-18 ENCOUNTER — Ambulatory Visit (AMBULATORY_SURGERY_CENTER): Payer: Self-pay | Admitting: *Deleted

## 2014-05-18 VITALS — Ht 65.0 in | Wt 140.0 lb

## 2014-05-18 DIAGNOSIS — Z1211 Encounter for screening for malignant neoplasm of colon: Secondary | ICD-10-CM

## 2014-05-18 MED ORDER — MOVIPREP 100 G PO SOLR
ORAL | Status: DC
Start: 2014-05-18 — End: 2014-05-29

## 2014-05-18 NOTE — Progress Notes (Signed)
Patient denies any allergies to eggs or soy. Patient denies any problems with anesthesia/sedation. Patient denies any oxygen use at home and does not take any diet/weight loss medications. EMMI education assisgned to patient on colonoscopy, this was explained and instructions given to patient. 

## 2014-05-28 ENCOUNTER — Encounter: Payer: Self-pay | Admitting: Internal Medicine

## 2014-05-29 ENCOUNTER — Encounter: Payer: Self-pay | Admitting: Internal Medicine

## 2014-05-29 ENCOUNTER — Ambulatory Visit (AMBULATORY_SURGERY_CENTER): Payer: BLUE CROSS/BLUE SHIELD | Admitting: Internal Medicine

## 2014-05-29 VITALS — BP 101/61 | HR 61 | Temp 98.4°F | Resp 28

## 2014-05-29 DIAGNOSIS — Z1211 Encounter for screening for malignant neoplasm of colon: Secondary | ICD-10-CM

## 2014-05-29 MED ORDER — SODIUM CHLORIDE 0.9 % IV SOLN: 500.0000 mL | INTRAVENOUS | Status: DC

## 2014-05-29 NOTE — Op Note (Signed)
[facility] [address] [place], [id number]   COLONOSCOPY PROCEDURE REPORT  PATIENT: Jessica Hickman, Jessica Hickman  MR#: [id number] BIRTHDATE: 1960-10-21 , 53  yrs. old GENDER: female ENDOSCOPIST: Hart Carwinora M Najeh Credit, MD REFERRED XB:JYNWGBY:Peter Amador CunasKwiatkowski, M.D. PROCEDURE DATE:  05/29/2014 PROCEDURE:   Colonoscopy, screening First Screening Colonoscopy - Avg.  risk and is 50 yrs.  old or older Yes.  Prior Negative Screening - Now for repeat screening. N/A  History of Adenoma - Now for follow-up colonoscopy & has been > or = to 3 yrs.  N/A ASA CLASS:   Class I INDICATIONS:Screening for colonic neoplasia and Colorectal Neoplasm Risk Assessment for this procedure is average risk. MEDICATIONS: Monitored anesthesia care and Propofol 250 mg IV  DESCRIPTION OF PROCEDURE:   After the risks benefits and alternatives of the procedure were thoroughly explained, informed consent was obtained.  The digital rectal exam revealed no abnormalities of the rectum.   The LB [id number]  endoscope was introduced through the anus and advanced to the cecum, which was identified by both the appendix and ileocecal valve. No adverse events experienced.   The quality of the prep was good.  (MoviPrep was used)  The instrument was then slowly withdrawn as the colon was fully examined.      COLON FINDINGS: A normal appearing cecum, ileocecal valve, and appendiceal orifice were identified.  The ascending, transverse, descending, sigmoid colon, and rectum appeared unremarkable. Retroflexed views revealed no abnormalities. The time to cecum = 9.04 Withdrawal time = 6.06   The scope was withdrawn and the procedure completed. COMPLICATIONS: There were no immediate complications.  ENDOSCOPIC IMPRESSION: Normal colonoscopy  RECOMMENDATIONS: High-fiber diet Recall colonoscopy in 10 years  eSigned:  Hart Carwinora M Juri Dinning, MD 05/29/2014 11:17 AM   cc:

## 2014-05-29 NOTE — Progress Notes (Signed)
Patient awakening,vss,report to rn 

## 2014-05-29 NOTE — Patient Instructions (Signed)
YOU HAD AN ENDOSCOPIC PROCEDURE TODAY AT THE [facility]:   Refer to the procedure report that was given to you for any specific questions about what was found during the examination.  If the procedure report does not answer your questions, please call your gastroenterologist to clarify.  If you requested that your care partner not be given the details of your procedure findings, then the procedure report has been included in a sealed envelope for you to review at your convenience later.  YOU SHOULD EXPECT: Some feelings of bloating in the abdomen. Passage of more gas than usual.  Walking can help get rid of the air that was put into your GI tract during the procedure and reduce the bloating. If you had a lower endoscopy (such as a colonoscopy or flexible sigmoidoscopy) you may notice spotting of blood in your stool or on the toilet paper. If you underwent a bowel prep for your procedure, you may not have a normal bowel movement for a few days.  Please Note:  You might notice some irritation and congestion in your nose or some drainage.  This is from the oxygen used during your procedure.  There is no need for concern and it should clear up in a day or so.  SYMPTOMS TO REPORT IMMEDIATELY:   Following lower endoscopy (colonoscopy or flexible sigmoidoscopy):  Excessive amounts of blood in the stool  Significant tenderness or worsening of abdominal pains  Swelling of the abdomen that is new, acute  Fever of 100F or higher   For urgent or emergent issues, a gastroenterologist can be reached at any hour by calling [phone].   DIET: Your first meal following the procedure should be a small meal and then it is ok to progress to your normal diet. Heavy or fried foods are harder to digest and may make you feel nauseous or bloated.  Likewise, meals heavy in dairy and vegetables can increase bloating.  Drink plenty of fluids but you should avoid alcoholic beverages for 24 hours. Eat a  high fiber diet.  ACTIVITY:  You should plan to take it easy for the rest of today and you should NOT DRIVE or use heavy machinery until tomorrow (because of the sedation medicines used during the test).    FOLLOW UP: Our staff will call the number listed on your records the next business day following your procedure to check on you and address any questions or concerns that you may have regarding the information given to you following your procedure. If we do not reach you, we will leave a message.  However, if you are feeling well and you are not experiencing any problems, there is no need to return our call.  We will assume that you have returned to your regular daily activities without incident.  If any biopsies were taken you will be contacted by phone or by letter within the next 1-3 weeks.  Please call us at [phone] if you have not heard about the biopsies in 3 weeks.    SIGNATURES/CONFIDENTIALITY: You and/or your care partner have signed paperwork which will be entered into your electronic medical record.  These signatures attest to the fact that that the information above on your After Visit Summary has been reviewed and is understood.  Full responsibility of the confidentiality of this discharge information lies with you and/or your care-partner. 

## 2014-06-01 ENCOUNTER — Telehealth: Payer: Self-pay

## 2014-06-01 NOTE — Telephone Encounter (Signed)
  Follow up Call-  Call back number 05/29/2014  Post procedure Call Back phone  # [phone]  Permission to leave phone message Yes     Patient questions:  Do you have a fever, pain , or abdominal swelling? No. Pain Score  0 *  Have you tolerated food without any problems? Yes.    Have you been able to return to your normal activities? Yes.    Do you have any questions about your discharge instructions: Diet   No. Medications  No. Follow up visit  No.  Do you have questions or concerns about your Care? No.  Actions: * If pain score is 4 or above: No action needed, pain <4.  No problems per the pt. maw

## 2014-06-03 ENCOUNTER — Other Ambulatory Visit: Payer: Self-pay | Admitting: Obstetrics and Gynecology

## 2014-06-03 DIAGNOSIS — Z1231 Encounter for screening mammogram for malignant neoplasm of breast: Secondary | ICD-10-CM

## 2014-06-10 ENCOUNTER — Ambulatory Visit
Admission: RE | Admit: 2014-06-10 | Discharge: 2014-06-10 | Disposition: A | Discharge status: Home / Self Care | Payer: BLUE CROSS/BLUE SHIELD | Source: Ambulatory Visit | Attending: Obstetrics and Gynecology | Admitting: Obstetrics and Gynecology

## 2014-06-10 DIAGNOSIS — Z1231 Encounter for screening mammogram for malignant neoplasm of breast: Secondary | ICD-10-CM

## 2014-06-11 ENCOUNTER — Other Ambulatory Visit: Payer: Self-pay | Admitting: Obstetrics and Gynecology

## 2014-06-11 DIAGNOSIS — R928 Other abnormal and inconclusive findings on diagnostic imaging of breast: Secondary | ICD-10-CM

## 2014-06-17 ENCOUNTER — Ambulatory Visit
Admission: RE | Admit: 2014-06-17 | Discharge: 2014-06-17 | Disposition: A | Discharge status: Home / Self Care | Payer: BLUE CROSS/BLUE SHIELD | Source: Ambulatory Visit | Attending: Obstetrics and Gynecology | Admitting: Obstetrics and Gynecology

## 2014-06-17 DIAGNOSIS — R928 Other abnormal and inconclusive findings on diagnostic imaging of breast: Secondary | ICD-10-CM

## 2014-12-28 ENCOUNTER — Other Ambulatory Visit (INDEPENDENT_AMBULATORY_CARE_PROVIDER_SITE_OTHER): Payer: BLUE CROSS/BLUE SHIELD

## 2014-12-28 DIAGNOSIS — Z0000 Encounter for general adult medical examination without abnormal findings: Secondary | ICD-10-CM

## 2014-12-28 LAB — POCT URINALYSIS DIPSTICK
BILIRUBIN UA: NEGATIVE
GLUCOSE UA: NEGATIVE
Ketones, UA: NEGATIVE
Leukocytes, UA: NEGATIVE
Nitrite, UA: NEGATIVE
PH UA: 7
Protein, UA: NEGATIVE
SPEC GRAV UA: 1.01
Urobilinogen, UA: 0.2

## 2014-12-28 LAB — BASIC METABOLIC PANEL
BUN: 18 mg/dL (ref 6–23)
CALCIUM: 9.7 mg/dL (ref 8.4–10.5)
CO2: 30 meq/L (ref 19–32)
Chloride: 104 mEq/L (ref 96–112)
Creatinine, Ser: 0.65 mg/dL (ref 0.40–1.20)
GFR: 101.67 mL/min (ref >60.00)
Glucose, Bld: 94 mg/dL (ref 70–99)
Potassium: 4.3 mEq/L (ref 3.5–5.1)
Sodium: 140 mEq/L (ref 135–145)

## 2014-12-28 LAB — CBC WITH DIFFERENTIAL/PLATELET
Basophils Absolute: 0 10*3/uL (ref 0.0–0.1)
Basophils Relative: 0.8 % (ref 0.0–3.0)
Eosinophils Absolute: 0.1 10*3/uL (ref 0.0–0.7)
Eosinophils Relative: 2 % (ref 0.0–5.0)
HEMATOCRIT: 42.3 % (ref 36.0–46.0)
Hemoglobin: 14 g/dL (ref 12.0–15.0)
LYMPHS PCT: 44.2 % (ref 12.0–46.0)
Lymphs Abs: 2.5 10*3/uL (ref 0.7–4.0)
MCHC: 33 g/dL (ref 30.0–36.0)
MCV: 89.1 fl (ref 78.0–100.0)
MONOS PCT: 8.5 % (ref 3.0–12.0)
Monocytes Absolute: 0.5 10*3/uL (ref 0.1–1.0)
NEUTROS ABS: 2.5 10*3/uL (ref 1.4–7.7)
Neutrophils Relative %: 44.5 % (ref 43.0–77.0)
Platelets: 251 10*3/uL (ref 150.0–400.0)
RBC: 4.75 Mil/uL (ref 3.87–5.11)
RDW: 13.4 % (ref 11.5–15.5)
WBC: 5.7 10*3/uL (ref 4.0–10.5)

## 2014-12-28 LAB — LIPID PANEL
Cholesterol: 201 mg/dL — ABNORMAL HIGH (ref 0–200)
HDL: 63.9 mg/dL (ref >39.00)
LDL Cholesterol: 124 mg/dL — ABNORMAL HIGH (ref 0–99)
NONHDL: 137.19
TRIGLYCERIDES: 64 mg/dL (ref 0.0–149.0)
Total CHOL/HDL Ratio: 3
VLDL: 12.8 mg/dL (ref 0.0–40.0)

## 2014-12-28 LAB — HEPATIC FUNCTION PANEL
ALK PHOS: 61 U/L (ref 39–117)
ALT: 13 U/L (ref 0–35)
AST: 16 U/L (ref 0–37)
Albumin: 4.3 g/dL (ref 3.5–5.2)
Bilirubin, Direct: 0.1 mg/dL (ref 0.0–0.3)
TOTAL PROTEIN: 6.9 g/dL (ref 6.0–8.3)
Total Bilirubin: 0.5 mg/dL (ref 0.2–1.2)

## 2014-12-28 LAB — TSH: TSH: 2.57 u[IU]/mL (ref 0.35–4.50)

## 2015-01-12 ENCOUNTER — Other Ambulatory Visit: Payer: BLUE CROSS/BLUE SHIELD

## 2015-01-13 ENCOUNTER — Other Ambulatory Visit: Payer: BLUE CROSS/BLUE SHIELD

## 2015-01-25 ENCOUNTER — Ambulatory Visit (INDEPENDENT_AMBULATORY_CARE_PROVIDER_SITE_OTHER): Payer: BLUE CROSS/BLUE SHIELD | Admitting: Internal Medicine

## 2015-01-25 ENCOUNTER — Encounter: Payer: Self-pay | Admitting: Internal Medicine

## 2015-01-25 VITALS — BP 108/70 | HR 76 | Temp 98.0°F | Ht 63.58 in | Wt 143.0 lb

## 2015-01-25 DIAGNOSIS — Z0000 Encounter for general adult medical examination without abnormal findings: Secondary | ICD-10-CM | POA: Diagnosis not present

## 2015-01-25 NOTE — Progress Notes (Signed)
Pre visit review using our clinic review tool, if applicable. No additional management support is needed unless otherwise documented below in the visit note. 

## 2015-01-25 NOTE — Patient Instructions (Signed)

## 2015-01-25 NOTE — Progress Notes (Signed)
Subjective:    Patient ID: Jessica Hickman, female    DOB: 11-01-1960, 54 y.o.   MRN: [id number]  HPI   Subjective:    Patient ID: Jessica Hickman, female    DOB: [birth year], 54 y.o.   MRN: [id number]  HPI  54  -year-old patient who is seen today for a health maintenance examination.  She has enjoyed excellent health and has no concerns or complaints. She takes no chronic medications. She has been followed closely by orthopedics due to chronic right hip pain. She has been referred to [facility] for further evaluation.  She has early degenerative joint disease which has been fairly stable.  She does require periodic cortisone injections with some benefit.  She is followed by GYN.  Colonoscopy 4/16.  Mammogram 5/16.  Wt Readings from Last 3 Encounters:  01/25/15 143 lb (64.864 kg)  05/18/14 140 lb (63.504 kg)  02/04/14 147 lb (66.679 kg)    Current Allergies:  No known allergies   Past Medical History:   gravida 5, para 5 abortus zero   Past Surgical History:   breast biopsy for benign disease. 2008  Colonoscopy 4/16  Family History:  father died age 40, complications of coronary artery disease, status post aortic valve repair, history of type 2 diabetes  Mother age 92,  history of hypertension and DJD One brother one sister both with obesity, diabetes, and hypertension   Social History:  Married  Never Smoked  5 children, ages 39, through 13   Risk Factors:   Tobacco use: never    Past Medical History  Diagnosis Date  . Hemorrhoids   . Arthritis     hip    Social History   Social History  . Marital Status: Married    Spouse Name: N/A  . Number of Children: N/A  . Years of Education: N/A   Occupational History  . Not on file.   Social History Main Topics  . Smoking status: Never Smoker   . Smokeless tobacco: Never Used  . Alcohol Use: 1.8 oz/week    3 Glasses of wine per week     Comment: 2 glasses of wine per week  . Drug Use: No  . Sexual  Activity: Not on file   Other Topics Concern  . Not on file   Social History Narrative    Past Surgical History  Procedure Laterality Date  . Breast biopsy for benign disease  2008    Family History  Problem Relation Age of Onset  . High blood pressure Mother   . Diabetes Father   . Heart disease Father   . Breast cancer Paternal Aunt   . Colon cancer Neg Hx     No Known Allergies  Current Outpatient Prescriptions on File Prior to Visit  Medication Sig Dispense Refill  . hydrocortisone (ANUSOL-HC) 25 MG suppository Place 1 suppository (25 mg total) rectally 2 (two) times daily as needed. 12 suppository 1  . ibuprofen (ADVIL,MOTRIN) 200 MG tablet Take 400 mg by mouth every 6 (six) hours as needed for pain.    Marland Kitchen lidocaine (XYLOCAINE) 2 % jelly Apply 1 application topically 2 (two) times daily as needed. 30 mL 1   No current facility-administered medications on file prior to visit.    BP 108/70 mmHg  Pulse 76  Temp(Src) 98 F (36.7 C) (Oral)  Ht 5' 3.58" (1.615 m)  Wt 143 lb (64.864 kg)  BMI 24.87 kg/m2  SpO2 97%  LMP 02/14/2012  Review of Systems  Constitutional: Negative for fever, appetite change, fatigue and unexpected weight change.  HENT: Negative for hearing loss, ear pain, nosebleeds, congestion, sore throat, mouth sores, trouble swallowing, neck stiffness, dental problem, voice change, sinus pressure and tinnitus.   Eyes: Negative for photophobia, pain, redness and visual disturbance.  Respiratory: Negative for cough, chest tightness and shortness of breath.   Cardiovascular: Negative for chest pain, palpitations and leg swelling.  Gastrointestinal: Negative for nausea, vomiting, abdominal pain, diarrhea, constipation, blood in stool, abdominal distention and rectal pain.  Genitourinary: Negative for dysuria, urgency, frequency, hematuria, flank pain, vaginal bleeding, vaginal discharge, difficulty urinating, genital sores, vaginal pain, menstrual  problem and pelvic pain.  Musculoskeletal: Negative for back pain and arthralgias.  Skin: Negative for rash.  Neurological: Negative for dizziness, syncope, speech difficulty, weakness, light-headedness, numbness and headaches.  Hematological: Negative for adenopathy. Does not bruise/bleed easily.  Psychiatric/Behavioral: Negative for suicidal ideas, behavioral problems, self-injury, dysphoric mood and agitation. The patient is not nervous/anxious.        Objective:   Physical Exam  Constitutional: She is oriented to person, place, and time. She appears well-developed and well-nourished.  HENT:  Head: Normocephalic and atraumatic.  Right Ear: External ear normal.  Left Ear: External ear normal.  Mouth/Throat: Oropharynx is clear and moist.  Eyes: Conjunctivae normal and EOM are normal.  Neck: Normal range of motion. Neck supple. No JVD present. No thyromegaly present.  Cardiovascular: Normal rate, regular rhythm, normal heart sounds and intact distal pulses.   No murmur heard. Pulmonary/Chest: Effort normal and breath sounds normal. She has no wheezes. She has no rales.  Abdominal: Soft. Bowel sounds are normal. She exhibits no distension and no mass. There is no tenderness. There is no rebound and no guarding.  Musculoskeletal: Normal range of motion. She exhibits no edema and no tenderness.  Neurological: She is alert and oriented to person, place, and time. She has normal reflexes. No cranial nerve deficit. She exhibits normal muscle tone. Coordination normal.  Skin: Skin is warm and dry. No rash noted.  Psychiatric: She has a normal mood and affect. Her behavior is normal.          Assessment & Plan:    Preventive health examination  Continue active lifestyle Recheck 1 year or as needed  Wt Readings from Last 3 Encounters:  01/25/15 143 lb (64.864 kg)  05/18/14 140 lb (63.504 kg)  02/04/14 147 lb (66.679 kg)    Review of Systems     as above. Unremarkable except  for chronic right hip pain Objective:   Physical Exam   As above Full range of motion both hips      Assessment & Plan:  Preventive health exam Screening colonoscopy at 10 year intervals Follow-up OB/GYN  Recheck one year

## 2015-08-23 ENCOUNTER — Telehealth: Payer: Self-pay | Admitting: Internal Medicine

## 2015-08-23 NOTE — Telephone Encounter (Signed)
Pt would like to see if you could call her in some medication for high altitude will be gone for 8 days leaving 7[place]16 for [place].

## 2015-08-23 NOTE — Telephone Encounter (Signed)
Please see message and advise 

## 2015-08-24 MED ORDER — ACETAZOLAMIDE 125 MG PO TABS: 125.0000 mg | ORAL_TABLET | Freq: Two times a day (BID) | ORAL | Status: DC

## 2015-08-24 NOTE — Telephone Encounter (Signed)
Spoke with Jessica Hickman and informed her that I have called in the Rx for her and her husband. Instructions given, pt verbalized understanding.

## 2015-08-24 NOTE — Telephone Encounter (Signed)
Generic Diamox 125 one twice a day starting 2 days prior to sent

## 2015-10-01 ENCOUNTER — Other Ambulatory Visit: Payer: Self-pay | Admitting: Obstetrics and Gynecology

## 2015-10-01 DIAGNOSIS — Z1231 Encounter for screening mammogram for malignant neoplasm of breast: Secondary | ICD-10-CM

## 2015-11-02 ENCOUNTER — Ambulatory Visit
Admission: RE | Admit: 2015-11-02 | Discharge: 2015-11-02 | Disposition: A | Discharge status: Home / Self Care | Payer: BLUE CROSS/BLUE SHIELD | Source: Ambulatory Visit | Attending: Obstetrics and Gynecology | Admitting: Obstetrics and Gynecology

## 2015-11-02 DIAGNOSIS — Z1231 Encounter for screening mammogram for malignant neoplasm of breast: Secondary | ICD-10-CM

## 2015-12-23 ENCOUNTER — Other Ambulatory Visit (INDEPENDENT_AMBULATORY_CARE_PROVIDER_SITE_OTHER): Payer: BLUE CROSS/BLUE SHIELD

## 2015-12-23 DIAGNOSIS — Z0000 Encounter for general adult medical examination without abnormal findings: Secondary | ICD-10-CM

## 2015-12-23 LAB — BASIC METABOLIC PANEL
BUN: 17 mg/dL (ref 6–23)
CALCIUM: 9.7 mg/dL (ref 8.4–10.5)
CO2: 30 mEq/L (ref 19–32)
CREATININE: 0.77 mg/dL (ref 0.40–1.20)
Chloride: 104 mEq/L (ref 96–112)
GFR: 83.3 mL/min (ref >60.00)
Glucose, Bld: 86 mg/dL (ref 70–99)
Potassium: 3.5 mEq/L (ref 3.5–5.1)
Sodium: 142 mEq/L (ref 135–145)

## 2015-12-23 LAB — CBC WITH DIFFERENTIAL/PLATELET
BASOS ABS: 0 10*3/uL (ref 0.0–0.1)
Basophils Relative: 0.7 % (ref 0.0–3.0)
EOS ABS: 0.1 10*3/uL (ref 0.0–0.7)
Eosinophils Relative: 1.1 % (ref 0.0–5.0)
HCT: 41.5 % (ref 36.0–46.0)
HEMOGLOBIN: 14 g/dL (ref 12.0–15.0)
Lymphocytes Relative: 41.6 % (ref 12.0–46.0)
Lymphs Abs: 2.4 10*3/uL (ref 0.7–4.0)
MCHC: 33.8 g/dL (ref 30.0–36.0)
MCV: 87.5 fl (ref 78.0–100.0)
MONO ABS: 0.4 10*3/uL (ref 0.1–1.0)
Monocytes Relative: 6.6 % (ref 3.0–12.0)
Neutro Abs: 2.9 10*3/uL (ref 1.4–7.7)
Neutrophils Relative %: 50 % (ref 43.0–77.0)
Platelets: 272 10*3/uL (ref 150.0–400.0)
RBC: 4.74 Mil/uL (ref 3.87–5.11)
RDW: 13.4 % (ref 11.5–15.5)
WBC: 5.7 10*3/uL (ref 4.0–10.5)

## 2015-12-23 LAB — POC URINALSYSI DIPSTICK (AUTOMATED)
Bilirubin, UA: NEGATIVE
GLUCOSE UA: NEGATIVE
KETONES UA: NEGATIVE
Leukocytes, UA: NEGATIVE
Nitrite, UA: NEGATIVE
PROTEIN UA: NEGATIVE
Urobilinogen, UA: 0.2
pH, UA: 7

## 2015-12-23 LAB — LIPID PANEL
CHOL/HDL RATIO: 3
Cholesterol: 188 mg/dL (ref 0–200)
HDL: 65.5 mg/dL (ref >39.00)
LDL CALC: 110 mg/dL — AB (ref 0–99)
NonHDL: 122.71
TRIGLYCERIDES: 66 mg/dL (ref 0.0–149.0)
VLDL: 13.2 mg/dL (ref 0.0–40.0)

## 2015-12-23 LAB — HEPATIC FUNCTION PANEL
ALK PHOS: 61 U/L (ref 39–117)
ALT: 15 U/L (ref 0–35)
AST: 17 U/L (ref 0–37)
Albumin: 4.5 g/dL (ref 3.5–5.2)
BILIRUBIN DIRECT: 0.1 mg/dL (ref 0.0–0.3)
BILIRUBIN TOTAL: 0.5 mg/dL (ref 0.2–1.2)
Total Protein: 7.2 g/dL (ref 6.0–8.3)

## 2015-12-23 LAB — TSH: TSH: 2.28 u[IU]/mL (ref 0.35–4.50)

## 2016-01-28 ENCOUNTER — Encounter: Payer: Self-pay | Admitting: Internal Medicine

## 2016-01-28 ENCOUNTER — Ambulatory Visit (INDEPENDENT_AMBULATORY_CARE_PROVIDER_SITE_OTHER): Payer: BLUE CROSS/BLUE SHIELD | Admitting: Internal Medicine

## 2016-01-28 VITALS — BP 98/82 | HR 80 | Temp 98.4°F | Ht 63.0 in | Wt 145.6 lb

## 2016-01-28 DIAGNOSIS — Z0000 Encounter for general adult medical examination without abnormal findings: Secondary | ICD-10-CM | POA: Diagnosis not present

## 2016-01-28 NOTE — Progress Notes (Signed)
Subjective:    Patient ID: Jessica Hickman, female    DOB: 10/26/1960, 55 y.o.   MRN: [id number]  HPI 55 year old patient who is seen today for a wellness exam.  She is followed annually by gynecology.  No concerns or complaints Laboratory studies reviewed  Past Medical History:  Diagnosis Date  . Arthritis    hip  . Hemorrhoids      Social History   Social History  . Marital status: Married    Spouse name: N/A  . Number of children: N/A  . Years of education: N/A   Occupational History  . Not on file.   Social History Main Topics  . Smoking status: Never Smoker  . Smokeless tobacco: Never Used  . Alcohol use 1.8 oz/week    3 Glasses of wine per week     Comment: 2 glasses of wine per week  . Drug use: No  . Sexual activity: Not on file   Other Topics Concern  . Not on file   Social History Narrative  . No narrative on file    Past Surgical History:  Procedure Laterality Date  . breast biopsy for benign disease  2008    Family History  Problem Relation Age of Onset  . High blood pressure Mother   . Diabetes Father   . Heart disease Father   . Breast cancer Paternal Aunt   . Colon cancer Neg Hx     No Known Allergies  Current Outpatient Prescriptions on File Prior to Visit  Medication Sig Dispense Refill  . hydrocortisone (ANUSOL-HC) 25 MG suppository Place 1 suppository (25 mg total) rectally 2 (two) times daily as needed. 12 suppository 1  . lidocaine (XYLOCAINE) 2 % jelly Apply 1 application topically 2 (two) times daily as needed. 30 mL 1  . ibuprofen (ADVIL,MOTRIN) 200 MG tablet Take 400 mg by mouth every 6 (six) hours as needed for pain.     No current facility-administered medications on file prior to visit.     BP 98/82 (BP Location: Left Arm, Patient Position: Sitting, Cuff Size: Normal)   Pulse 80   Temp 98.4 F (36.9 C) (Oral)   Ht 5\' 3"  (1.6 m)   Wt 145 lb 9.6 oz (66 kg)   LMP 02/14/2012   SpO2 96%   BMI 25.79 kg/m     Current  Allergies:  No known allergies   Past Medical History:   gravida 5, para 5 abortus zero   Past Surgical History:   breast biopsy for benign disease. 2008  Colonoscopy 4/16  Family History:  father died age 53, complications of coronary artery disease, status post aortic valve repair, history of type 2 diabetes  Mother age 53,  history of hypertension and DJD One brother one sister both with obesity, diabetes, and hypertension   Social History:  Married  Never Smoked  5 children, ages 2414, through 3924   Risk Factors:   Tobacco use: never   Review of Systems  Constitutional: Negative for appetite change, fatigue, fever and unexpected weight change.  HENT: Negative for congestion, dental problem, ear pain, hearing loss, mouth sores, nosebleeds, sinus pressure, sore throat, tinnitus, trouble swallowing and voice change.   Eyes: Negative for photophobia, pain, redness and visual disturbance.  Respiratory: Negative for cough, chest tightness and shortness of breath.   Cardiovascular: Negative for chest pain, palpitations and leg swelling.  Gastrointestinal: Negative for abdominal distention, abdominal pain, blood in stool, constipation, diarrhea, nausea, rectal pain and vomiting.  Genitourinary: Negative for difficulty urinating, dysuria, flank pain, frequency, genital sores, hematuria, menstrual problem, pelvic pain, urgency, vaginal bleeding, vaginal discharge and vaginal pain.  Musculoskeletal: Negative for arthralgias, back pain and neck stiffness.  Skin: Negative for rash.  Neurological: Negative for dizziness, syncope, speech difficulty, weakness, light-headedness, numbness and headaches.  Hematological: Negative for adenopathy. Does not bruise/bleed easily.  Psychiatric/Behavioral: Negative for agitation, behavioral problems, dysphoric mood, self-injury and suicidal ideas. The patient is not nervous/anxious.        Objective:   Physical Exam  Constitutional: She is  oriented to person, place, and time. She appears well-developed and well-nourished.  HENT:  Head: Normocephalic and atraumatic.  Right Ear: External ear normal.  Left Ear: External ear normal.  Mouth/Throat: Oropharynx is clear and moist.  Eyes: Conjunctivae and EOM are normal.  Neck: Normal range of motion. Neck supple. No JVD present. No thyromegaly present.  Cardiovascular: Normal rate, regular rhythm, normal heart sounds and intact distal pulses.   No murmur heard. Pulmonary/Chest: Effort normal and breath sounds normal. She has no wheezes. She has no rales.  Abdominal: Soft. Bowel sounds are normal. She exhibits no distension and no mass. There is no tenderness. There is no rebound and no guarding.  Genitourinary: Vagina normal.  Musculoskeletal: Normal range of motion. She exhibits no edema or tenderness.  Neurological: She is alert and oriented to person, place, and time. She has normal reflexes. No cranial nerve deficit. She exhibits normal muscle tone. Coordination normal.  Skin: Skin is warm and dry. No rash noted.  Psychiatric: She has a normal mood and affect. Her behavior is normal.          Assessment & Plan:   Preventive health examination  Laboratory studies reviewed Follow-up one year or as needed Calcium vitamin D supplements encouraged  Rogelia BogaKWIATKOWSKI,Johann Gascoigne FRANK

## 2016-01-28 NOTE — Patient Instructions (Addendum)

## 2016-10-20 ENCOUNTER — Other Ambulatory Visit: Payer: Self-pay | Admitting: Obstetrics and Gynecology

## 2016-10-20 DIAGNOSIS — Z1231 Encounter for screening mammogram for malignant neoplasm of breast: Secondary | ICD-10-CM

## 2016-12-19 ENCOUNTER — Ambulatory Visit: Payer: BLUE CROSS/BLUE SHIELD

## 2016-12-25 ENCOUNTER — Ambulatory Visit (INDEPENDENT_AMBULATORY_CARE_PROVIDER_SITE_OTHER): Payer: BLUE CROSS/BLUE SHIELD | Admitting: Internal Medicine

## 2016-12-25 ENCOUNTER — Encounter: Payer: Self-pay | Admitting: Internal Medicine

## 2016-12-25 VITALS — BP 96/54 | HR 66 | Temp 97.7°F | Ht 63.0 in | Wt 156.2 lb

## 2016-12-25 DIAGNOSIS — Z23 Encounter for immunization: Secondary | ICD-10-CM | POA: Diagnosis not present

## 2016-12-25 DIAGNOSIS — Z0000 Encounter for general adult medical examination without abnormal findings: Secondary | ICD-10-CM | POA: Diagnosis not present

## 2016-12-25 LAB — CBC WITH DIFFERENTIAL/PLATELET
BASOS ABS: 0 10*3/uL (ref 0.0–0.1)
BASOS PCT: 0.5 % (ref 0.0–3.0)
Eosinophils Absolute: 0.1 10*3/uL (ref 0.0–0.7)
Eosinophils Relative: 1.1 % (ref 0.0–5.0)
HEMATOCRIT: 42.1 % (ref 36.0–46.0)
HEMOGLOBIN: 13.6 g/dL (ref 12.0–15.0)
LYMPHS PCT: 37.2 % (ref 12.0–46.0)
Lymphs Abs: 2.1 10*3/uL (ref 0.7–4.0)
MCHC: 32.3 g/dL (ref 30.0–36.0)
MCV: 91.3 fl (ref 78.0–100.0)
MONOS PCT: 6.4 % (ref 3.0–12.0)
Monocytes Absolute: 0.4 10*3/uL (ref 0.1–1.0)
NEUTROS ABS: 3 10*3/uL (ref 1.4–7.7)
Neutrophils Relative %: 54.8 % (ref 43.0–77.0)
PLATELETS: 248 10*3/uL (ref 150.0–400.0)
RBC: 4.61 Mil/uL (ref 3.87–5.11)
RDW: 13.3 % (ref 11.5–15.5)
WBC: 5.5 10*3/uL (ref 4.0–10.5)

## 2016-12-25 LAB — COMPREHENSIVE METABOLIC PANEL
ALBUMIN: 4.2 g/dL (ref 3.5–5.2)
ALT: 14 U/L (ref 0–35)
AST: 15 U/L (ref 0–37)
Alkaline Phosphatase: 56 U/L (ref 39–117)
BILIRUBIN TOTAL: 0.5 mg/dL (ref 0.2–1.2)
BUN: 15 mg/dL (ref 6–23)
CALCIUM: 9.6 mg/dL (ref 8.4–10.5)
CO2: 31 mEq/L (ref 19–32)
CREATININE: 0.64 mg/dL (ref 0.40–1.20)
Chloride: 103 mEq/L (ref 96–112)
GFR: 102.72 mL/min (ref >60.00)
Glucose, Bld: 97 mg/dL (ref 70–99)
Potassium: 3.8 mEq/L (ref 3.5–5.1)
SODIUM: 142 meq/L (ref 135–145)
Total Protein: 6.6 g/dL (ref 6.0–8.3)

## 2016-12-25 LAB — LIPID PANEL
CHOLESTEROL: 203 mg/dL — AB (ref 0–200)
HDL: 60.3 mg/dL (ref >39.00)
LDL Cholesterol: 124 mg/dL — ABNORMAL HIGH (ref 0–99)
NonHDL: 142.4
Total CHOL/HDL Ratio: 3
Triglycerides: 93 mg/dL (ref 0.0–149.0)
VLDL: 18.6 mg/dL (ref 0.0–40.0)

## 2016-12-25 LAB — TSH: TSH: 1.84 u[IU]/mL (ref 0.35–4.50)

## 2016-12-25 NOTE — Progress Notes (Signed)
Subjective:    Patient ID: Lanelle Balllen Cates, female    DOB: April 05, 1960, 56 y.o.   MRN: [id number]  HPI 56 year old patient who is seen today for a preventive health examination She enjoys excellent health and is followed by gynecology.  She is scheduled for her annual mammogram later this month.  Colonoscopy 2016 which was normal. No concerns or complaints.  Past Medical History:  Diagnosis Date  . Arthritis    hip  . Hemorrhoids      Social History   Socioeconomic History  . Marital status: Married    Spouse name: Not on file  . Number of children: Not on file  . Years of education: Not on file  . Highest education level: Not on file  Social Needs  . Financial resource strain: Not on file  . Food insecurity - worry: Not on file  . Food insecurity - inability: Not on file  . Transportation needs - medical: Not on file  . Transportation needs - non-medical: Not on file  Occupational History  . Not on file  Tobacco Use  . Smoking status: Never Smoker  . Smokeless tobacco: Never Used  Substance and Sexual Activity  . Alcohol use: Yes    Alcohol/week: 1.8 oz    Types: 3 Glasses of wine per week    Comment: 2 glasses of wine per week  . Drug use: No  . Sexual activity: Not on file  Other Topics Concern  . Not on file  Social History Narrative  . Not on file    Past Surgical History:  Procedure Laterality Date  . breast biopsy for benign disease  2008    Family History  Problem Relation Age of Onset  . High blood pressure Mother   . Diabetes Father   . Heart disease Father   . Breast cancer Paternal Aunt   . Colon cancer Neg Hx     No Known Allergies    BP (!) 96/54 (BP Location: Left Arm, Patient Position: Sitting, Cuff Size: Normal)   Pulse 66   Temp 97.7 F (36.5 C) (Oral)   Ht 5\' 3"  (1.6 m)   Wt 156 lb 3.2 oz (70.9 kg)   LMP 02/14/2012   SpO2 98%   BMI 27.67 kg/m       Review of Systems  Constitutional: Negative.   HENT: Negative for  congestion, dental problem, hearing loss, rhinorrhea, sinus pressure, sore throat and tinnitus.   Eyes: Negative for pain, discharge and visual disturbance.  Respiratory: Negative for cough and shortness of breath.   Cardiovascular: Negative for chest pain, palpitations and leg swelling.  Gastrointestinal: Negative for abdominal distention, abdominal pain, blood in stool, constipation, diarrhea, nausea and vomiting.  Genitourinary: Negative for difficulty urinating, dysuria, flank pain, frequency, hematuria, pelvic pain, urgency, vaginal bleeding, vaginal discharge and vaginal pain.  Musculoskeletal: Negative for arthralgias, gait problem and joint swelling.  Skin: Negative for rash.  Neurological: Negative for dizziness, syncope, speech difficulty, weakness, numbness and headaches.  Hematological: Negative for adenopathy.  Psychiatric/Behavioral: Negative for agitation, behavioral problems and dysphoric mood. The patient is not nervous/anxious.        Objective:   Physical Exam  Constitutional: She is oriented to person, place, and time. She appears well-developed and well-nourished.  HENT:  Head: Normocephalic and atraumatic.  Right Ear: External ear normal.  Left Ear: External ear normal.  Mouth/Throat: Oropharynx is clear and moist.  Eyes: Conjunctivae and EOM are normal.  Neck: Normal range of motion.  Neck supple. No JVD present. No thyromegaly present.  Cardiovascular: Normal rate, regular rhythm, normal heart sounds and intact distal pulses.  No murmur heard. Pulmonary/Chest: Effort normal and breath sounds normal. She has no wheezes. She has no rales.  Abdominal: Soft. Bowel sounds are normal. She exhibits no distension and no mass. There is no tenderness. There is no rebound and no guarding.  Musculoskeletal: Normal range of motion. She exhibits no edema or tenderness.  Neurological: She is alert and oriented to person, place, and time. She has normal reflexes. No cranial nerve  deficit. She exhibits normal muscle tone. Coordination normal.  Brisk reflexes  Skin: Skin is warm and dry. No rash noted.  Psychiatric: She has a normal mood and affect. Her behavior is normal.          Assessment & Plan:   Preventive health examination  Annual mammogram has been scheduled.  The patient is up-to-date on colonoscopies. She is scheduled for follow-up gynecology visit next year She takes no chronic medications. Flu vaccine and Tdap administered  Follow-up one year or as needed  Rogelia BogaKWIATKOWSKI,PETER FRANK

## 2016-12-25 NOTE — Patient Instructions (Addendum)
Health Maintenance for Postmenopausal Women Menopause is a normal process in which your reproductive ability comes to an end. This process happens gradually over a span of months to years, usually between the ages of 22 and 9. Menopause is complete when you have missed 12 consecutive menstrual periods. It is important to talk with your health care provider about some of the most common conditions that affect postmenopausal women, such as heart disease, cancer, and bone loss (osteoporosis). Adopting a healthy lifestyle and getting preventive care can help to promote your health and wellness. Those actions can also lower your chances of developing some of these common conditions. What should I know about menopause? During menopause, you may experience a number of symptoms, such as:  Moderate-to-severe hot flashes.  Night sweats.  Decrease in sex drive.  Mood swings.  Headaches.  Tiredness.  Irritability.  Memory problems.  Insomnia.  Choosing to treat or not to treat menopausal changes is an individual decision that you make with your health care provider. What should I know about hormone replacement therapy and supplements? Hormone therapy products are effective for treating symptoms that are associated with menopause, such as hot flashes and night sweats. Hormone replacement carries certain risks, especially as you become older. If you are thinking about using estrogen or estrogen with progestin treatments, discuss the benefits and risks with your health care provider. What should I know about heart disease and stroke? Heart disease, heart attack, and stroke become more likely as you age. This may be due, in part, to the hormonal changes that your body experiences during menopause. These can affect how your body processes dietary fats, triglycerides, and cholesterol. Heart attack and stroke are both medical emergencies. There are many things that you can do to help prevent heart disease  and stroke:  Have your blood pressure checked at least every 1-2 years. High blood pressure causes heart disease and increases the risk of stroke.  If you are 53-22 years old, ask your health care provider if you should take aspirin to prevent a heart attack or a stroke.  Do not use any tobacco products, including cigarettes, chewing tobacco, or electronic cigarettes. If you need help quitting, ask your health care provider.  It is important to eat a healthy diet and maintain a healthy weight. ? Be sure to include plenty of vegetables, fruits, low-fat dairy products, and lean protein. ? Avoid eating foods that are high in solid fats, added sugars, or salt (sodium).  Get regular exercise. This is one of the most important things that you can do for your health. ? Try to exercise for at least 150 minutes each week. The type of exercise that you do should increase your heart rate and make you sweat. This is known as moderate-intensity exercise. ? Try to do strengthening exercises at least twice each week. Do these in addition to the moderate-intensity exercise.  Know your numbers.Ask your health care provider to check your cholesterol and your blood glucose. Continue to have your blood tested as directed by your health care provider.  What should I know about cancer screening? There are several types of cancer. Take the following steps to reduce your risk and to catch any cancer development as early as possible. Breast Cancer  Practice breast self-awareness. ? This means understanding how your breasts normally appear and feel. ? It also means doing regular breast self-exams. Let your health care provider know about any changes, no matter how small.  If you are 40  or older, have a clinician do a breast exam (clinical breast exam or CBE) every year. Depending on your age, family history, and medical history, it may be recommended that you also have a yearly breast X-ray (mammogram).  If you  have a family history of breast cancer, talk with your health care provider about genetic screening.  If you are at high risk for breast cancer, talk with your health care provider about having an MRI and a mammogram every year.  Breast cancer (BRCA) gene test is recommended for women who have family members with BRCA-related cancers. Results of the assessment will determine the need for genetic counseling and BRCA1 and for BRCA2 testing. BRCA-related cancers include these types: ? Breast. This occurs in males or females. ? Ovarian. ? Tubal. This may also be called fallopian tube cancer. ? Cancer of the abdominal or pelvic lining (peritoneal cancer). ? Prostate. ? Pancreatic.  Cervical, Uterine, and Ovarian Cancer Your health care provider may recommend that you be screened regularly for cancer of the pelvic organs. These include your ovaries, uterus, and vagina. This screening involves a pelvic exam, which includes checking for microscopic changes to the surface of your cervix (Pap test).  For women ages 21-65, health care providers may recommend a pelvic exam and a Pap test every three years. For women ages 79-65, they may recommend the Pap test and pelvic exam, combined with testing for human papilloma virus (HPV), every five years. Some types of HPV increase your risk of cervical cancer. Testing for HPV may also be done on women of any age who have unclear Pap test results.  Other health care providers may not recommend any screening for nonpregnant women who are considered low risk for pelvic cancer and have no symptoms. Ask your health care provider if a screening pelvic exam is right for you.  If you have had past treatment for cervical cancer or a condition that could lead to cancer, you need Pap tests and screening for cancer for at least 20 years after your treatment. If Pap tests have been discontinued for you, your risk factors (such as having a new sexual partner) need to be  reassessed to determine if you should start having screenings again. Some women have medical problems that increase the chance of getting cervical cancer. In these cases, your health care provider may recommend that you have screening and Pap tests more often.  If you have a family history of uterine cancer or ovarian cancer, talk with your health care provider about genetic screening.  If you have vaginal bleeding after reaching menopause, tell your health care provider.  There are currently no reliable tests available to screen for ovarian cancer.  Lung Cancer Lung cancer screening is recommended for adults 69-62 years old who are at high risk for lung cancer because of a history of smoking. A yearly low-dose CT scan of the lungs is recommended if you:  Currently smoke.  Have a history of at least 30 pack-years of smoking and you currently smoke or have quit within the past 15 years. A pack-year is smoking an average of one pack of cigarettes per day for one year.  Yearly screening should:  Continue until it has been 15 years since you quit.  Stop if you develop a health problem that would prevent you from having lung cancer treatment.  Colorectal Cancer  This type of cancer can be detected and can often be prevented.  Routine colorectal cancer screening usually begins at  age 42 and continues through age 45.  If you have risk factors for colon cancer, your health care provider may recommend that you be screened at an earlier age.  If you have a family history of colorectal cancer, talk with your health care provider about genetic screening.  Your health care provider may also recommend using home test kits to check for hidden blood in your stool.  A small camera at the end of a tube can be used to examine your colon directly (sigmoidoscopy or colonoscopy). This is done to check for the earliest forms of colorectal cancer.  Direct examination of the colon should be repeated every  5-10 years until age 71. However, if early forms of precancerous polyps or small growths are found or if you have a family history or genetic risk for colorectal cancer, you may need to be screened more often.  Skin Cancer  Check your skin from head to toe regularly.  Monitor any moles. Be sure to tell your health care provider: ? About any new moles or changes in moles, especially if there is a change in a mole's shape or color. ? If you have a mole that is larger than the size of a pencil eraser.  If any of your family members has a history of skin cancer, especially at a young age, talk with your health care provider about genetic screening.  Always use sunscreen. Apply sunscreen liberally and repeatedly throughout the day.  Whenever you are outside, protect yourself by wearing long sleeves, pants, a wide-brimmed hat, and sunglasses.  What should I know about osteoporosis? Osteoporosis is a condition in which bone destruction happens more quickly than new bone creation. After menopause, you may be at an increased risk for osteoporosis. To help prevent osteoporosis or the bone fractures that can happen because of osteoporosis, the following is recommended:  If you are 46-71 years old, get at least 1,000 mg of calcium and at least 600 mg of vitamin D per day.  If you are older than age 55 but younger than age 65, get at least 1,200 mg of calcium and at least 600 mg of vitamin D per day.  If you are older than age 54, get at least 1,200 mg of calcium and at least 800 mg of vitamin D per day.  Smoking and excessive alcohol intake increase the risk of osteoporosis. Eat foods that are rich in calcium and vitamin D, and do weight-bearing exercises several times each week as directed by your health care provider. What should I know about how menopause affects my mental health? Depression may occur at any age, but it is more common as you become older. Common symptoms of depression  include:  Low or sad mood.  Changes in sleep patterns.  Changes in appetite or eating patterns.  Feeling an overall lack of motivation or enjoyment of activities that you previously enjoyed.  Frequent crying spells.  Talk with your health care provider if you think that you are experiencing depression. What should I know about immunizations? It is important that you get and maintain your immunizations. These include:  Tetanus, diphtheria, and pertussis (Tdap) booster vaccine.  Influenza every year before the flu season begins.  Pneumonia vaccine.  Shingles vaccine.  Your health care provider may also recommend other immunizations. This information is not intended to replace advice given to you by your health care provider. Make sure you discuss any questions you have with your health care provider. Document Released: 03/24/2005  Document Revised: 08/20/2015 Document Reviewed: 11/03/2014 Elsevier Interactive Patient Education  2018 [employer]vier [employer].  

## 2016-12-26 LAB — HEPATITIS C ANTIBODY
HEP C AB: NONREACTIVE
SIGNAL TO CUT-OFF: 0.01 (ref <1.00)

## 2016-12-29 ENCOUNTER — Ambulatory Visit
Admission: RE | Admit: 2016-12-29 | Discharge: 2016-12-29 | Disposition: A | Discharge status: Home / Self Care | Payer: BLUE CROSS/BLUE SHIELD | Source: Ambulatory Visit | Attending: Obstetrics and Gynecology | Admitting: Obstetrics and Gynecology

## 2016-12-29 DIAGNOSIS — Z1231 Encounter for screening mammogram for malignant neoplasm of breast: Secondary | ICD-10-CM | POA: Diagnosis not present

## 2017-01-01 ENCOUNTER — Encounter: Payer: BLUE CROSS/BLUE SHIELD | Admitting: Internal Medicine

## 2017-01-15 ENCOUNTER — Telehealth: Payer: Self-pay | Admitting: Internal Medicine

## 2017-01-16 NOTE — Telephone Encounter (Signed)
The patient came in 01/15/17 and signed the form and I faxed it to the number provided. After the fax confirmation was confirmed I put the form in the stack to send to scan.

## 2017-11-05 ENCOUNTER — Telehealth: Payer: Self-pay

## 2017-11-05 NOTE — Telephone Encounter (Signed)
Copied from CRM [phone]. Topic: Appointment Scheduling - Scheduling Inquiry for Clinic >> Nov 05, 2017 11:42 AM Baldo DaubAlexander, Amber L wrote: Reason for CRM:   Pt was seeing Dr. Kirtland BouchardK at [facility].  Pt wants to know if she can transfer care to Dr. Artis FlockWolfe.  Pt is tying to schedule CPE and lab work. *REASON FOR CRM* Pt wanted to know if this was okay due to personal relationship. Pt can be reached at [phone].

## 2017-11-05 NOTE — Telephone Encounter (Signed)
Called patient on mobile # and scheduled appt for TOC/cpe appt for 11/13.

## 2017-12-06 ENCOUNTER — Ambulatory Visit (INDEPENDENT_AMBULATORY_CARE_PROVIDER_SITE_OTHER): Payer: BLUE CROSS/BLUE SHIELD | Admitting: Family Medicine

## 2017-12-06 ENCOUNTER — Encounter: Payer: Self-pay | Admitting: Family Medicine

## 2017-12-06 VITALS — BP 116/70 | HR 69 | Temp 97.7°F | Ht 64.0 in | Wt 156.4 lb

## 2017-12-06 DIAGNOSIS — Z23 Encounter for immunization: Secondary | ICD-10-CM | POA: Diagnosis not present

## 2017-12-06 DIAGNOSIS — Z0000 Encounter for general adult medical examination without abnormal findings: Secondary | ICD-10-CM | POA: Diagnosis not present

## 2017-12-06 LAB — COMPREHENSIVE METABOLIC PANEL
ALBUMIN: 4.4 g/dL (ref 3.5–5.2)
ALT: 32 U/L (ref 0–35)
AST: 23 U/L (ref 0–37)
Alkaline Phosphatase: 62 U/L (ref 39–117)
BUN: 16 mg/dL (ref 6–23)
CALCIUM: 9.6 mg/dL (ref 8.4–10.5)
CO2: 27 mEq/L (ref 19–32)
Chloride: 104 mEq/L (ref 96–112)
Creatinine, Ser: 0.76 mg/dL (ref 0.40–1.20)
GFR: 83.94 mL/min (ref >60.00)
Glucose, Bld: 97 mg/dL (ref 70–99)
POTASSIUM: 4.1 meq/L (ref 3.5–5.1)
Sodium: 141 mEq/L (ref 135–145)
Total Bilirubin: 0.6 mg/dL (ref 0.2–1.2)
Total Protein: 6.9 g/dL (ref 6.0–8.3)

## 2017-12-06 LAB — CBC WITH DIFFERENTIAL/PLATELET
BASOS PCT: 0.9 % (ref 0.0–3.0)
Basophils Absolute: 0 10*3/uL (ref 0.0–0.1)
EOS PCT: 2 % (ref 0.0–5.0)
Eosinophils Absolute: 0.1 10*3/uL (ref 0.0–0.7)
HEMATOCRIT: 41.5 % (ref 36.0–46.0)
HEMOGLOBIN: 13.9 g/dL (ref 12.0–15.0)
LYMPHS PCT: 42 % (ref 12.0–46.0)
Lymphs Abs: 2.3 10*3/uL (ref 0.7–4.0)
MCHC: 33.5 g/dL (ref 30.0–36.0)
MCV: 89.6 fl (ref 78.0–100.0)
MONO ABS: 0.5 10*3/uL (ref 0.1–1.0)
Monocytes Relative: 8.5 % (ref 3.0–12.0)
Neutro Abs: 2.6 10*3/uL (ref 1.4–7.7)
Neutrophils Relative %: 46.6 % (ref 43.0–77.0)
Platelets: 290 10*3/uL (ref 150.0–400.0)
RBC: 4.64 Mil/uL (ref 3.87–5.11)
RDW: 13.8 % (ref 11.5–15.5)
WBC: 5.6 10*3/uL (ref 4.0–10.5)

## 2017-12-06 LAB — LIPID PANEL
CHOLESTEROL: 223 mg/dL — AB (ref 0–200)
HDL: 67.7 mg/dL (ref >39.00)
LDL Cholesterol: 138 mg/dL — ABNORMAL HIGH (ref 0–99)
NonHDL: 154.98
TRIGLYCERIDES: 84 mg/dL (ref 0.0–149.0)
Total CHOL/HDL Ratio: 3
VLDL: 16.8 mg/dL (ref 0.0–40.0)

## 2017-12-06 LAB — TSH: TSH: 2.09 u[IU]/mL (ref 0.35–4.50)

## 2017-12-06 NOTE — Progress Notes (Signed)
Patient: Jessica Hickman MRN: [id number] DOB: 13-Aug-1960 PCP: Orland Mustard, MD     Subjective:  Chief Complaint  Patient presents with  . Annual Exam    HPI: The patient is a 57 y.o. female who presents today for annual exam. She denies any changes to past medical history. There have been no recent hospitalizations. They are following a well balanced diet and exercise plan. Weight has been stable. No complaints today.   Immunization History  Administered Date(s) Administered  . DTaP 02/13/2005  . Influenza,inj,Quad PF,6+ Mos 12/25/2016, 12/06/2017  . Tdap 12/25/2016   Colonoscopy: 05/29/2014. F/u in 10 years.  Mammogram: 12/2016 Pap smear: unsure about this.  Tdap: 12/25/2016 Hep C: done 12/2016  Review of Systems  Constitutional: Negative for chills, fatigue and fever.  HENT: Negative for dental problem, ear pain, hearing loss and trouble swallowing.   Eyes: Negative for visual disturbance.  Respiratory: Negative for cough, chest tightness and shortness of breath.   Cardiovascular: Negative for chest pain, palpitations and leg swelling.  Gastrointestinal: Negative for abdominal pain, blood in stool, diarrhea and nausea.  Endocrine: Negative for cold intolerance, polydipsia, polyphagia and polyuria.  Genitourinary: Negative for dysuria and hematuria.  Musculoskeletal: Negative for arthralgias.  Skin: Negative for rash.  Neurological: Negative for dizziness and headaches.  Psychiatric/Behavioral: Negative for dysphoric mood and sleep disturbance. The patient is not nervous/anxious.       Allergies Patient has No Known Allergies.  Past Medical History Patient  has a past medical history of Arthritis and Hemorrhoids.  Surgical History Patient  has a past surgical history that includes breast biopsy for benign disease (2008) and Breast biopsy (Left).  Family History Pateint's family history includes Breast cancer in her paternal aunt; Diabetes in her father; Heart  disease in her father; High blood pressure in her mother.  Social History Patient  reports that she has never smoked. She has never used smokeless tobacco. She reports that she drinks about 3.0 standard drinks of alcohol per week. She reports that she does not use drugs.    Objective: Vitals:   12/06/17 0837  BP: 116/70  Pulse: 69  Temp: 97.7 F (36.5 C)  TempSrc: Oral  SpO2: 96%  Weight: 156 lb 6.4 oz (70.9 kg)  Height: 5\' 4"  (1.626 m)    Body mass index is 26.85 kg/m.  Physical Exam  Constitutional: She is oriented to person, place, and time. She appears well-developed and well-nourished.  HENT:  Right Ear: External ear normal.  Left Ear: External ear normal.  Mouth/Throat: Oropharynx is clear and moist.  Eyes: Pupils are equal, round, and reactive to light. Conjunctivae and EOM are normal.  Neck: Normal range of motion. Neck supple. No thyromegaly present.  Cardiovascular: Normal rate, regular rhythm, normal heart sounds and intact distal pulses.  No murmur heard. Pulmonary/Chest: Effort normal and breath sounds normal.  Abdominal: Soft. Bowel sounds are normal. She exhibits no distension. There is no tenderness.  Lymphadenopathy:    She has no cervical adenopathy.  Neurological: She is alert and oriented to person, place, and time. She displays normal reflexes. No cranial nerve deficit. Coordination normal.  Skin: Skin is warm and dry. No rash noted.  Psychiatric: She has a normal mood and affect. Her behavior is normal.  Vitals reviewed.      Assessment/plan: 1. Annual physical exam Routine lab work today, she is fasting. Flu shot. utd on health maintenance and will look into her pap smear. Encouraged more regular exercise. Overall doing great.  F/u in one year or as needed.  Patient counseling [x]    Nutrition: Stressed importance of moderation in sodium/caffeine intake, saturated fat and cholesterol, caloric balance, sufficient intake of fresh fruits, vegetables,  fiber, calcium, iron, and 1 mg of folate supplement per day (for females capable of pregnancy).  [x]    Stressed the importance of regular exercise.   [x]    Substance Abuse: Discussed cessation/primary prevention of tobacco, alcohol, or other drug use; driving or other dangerous activities under the influence; availability of treatment for abuse.   [x]    Injury prevention: Discussed safety belts, safety helmets, smoke detector, smoking near bedding or upholstery.   [x]    Sexuality: Discussed sexually transmitted diseases, partner selection, use of condoms, avoidance of unintended pregnancy  and contraceptive alternatives.  [x]    Dental health: Discussed importance of regular tooth brushing, flossing, and dental visits.  [x]    Health maintenance and immunizations reviewed. Please refer to Health maintenance section.    - CBC with Differential/Platelet - Comprehensive metabolic panel - Lipid panel - TSH  2. Need for prophylactic vaccination and inoculation against influenza  - Flu Vaccine QUAD 6+ mos PF IM (Fluarix Quad PF)     Return in about 1 year (around 12/07/2018) for annual .     Orland Mustard, MD  [facility]  12/06/2017

## 2017-12-06 NOTE — Progress Notes (Deleted)
Patient: Jessica Hickman MRN: [id number] DOB: 06-12-1960 PCP: Orland Mustard, MD     Subjective:  No chief complaint on file.   HPI: The patient is a 57 y.o. female who presents today for transfer of care from Dr. Amador Cunas.    Review of Systems  Constitutional: Negative for fatigue.  Respiratory: Negative for shortness of breath.   Cardiovascular: Negative for chest pain.  Gastrointestinal: Negative for abdominal pain and nausea.  Musculoskeletal: Negative for back pain and neck pain.  Skin: Negative.   Neurological: Negative for dizziness and headaches.  Psychiatric/Behavioral: Negative for sleep disturbance. The patient is not nervous/anxious.     Allergies Patient has No Known Allergies.  Past Medical History Patient  has a past medical history of Arthritis and Hemorrhoids.  Surgical History Patient  has a past surgical history that includes breast biopsy for benign disease (2008) and Breast biopsy (Left).  Family History Pateint's family history includes Breast cancer in her paternal aunt; Diabetes in her father; Heart disease in her father; High blood pressure in her mother.  Social History Patient  reports that she has never smoked. She has never used smokeless tobacco. She reports that she drinks about 3.0 standard drinks of alcohol per week. She reports that she does not use drugs.    Objective: Vitals:   12/06/17 0837  BP: 116/70  Pulse: 69  Temp: 97.7 F (36.5 C)  TempSrc: Oral  SpO2: 96%  Weight: 156 lb 6.4 oz (70.9 kg)  Height: 5\' 4"  (1.626 m)    Body mass index is 26.85 kg/m.  Physical Exam Depression screen [facility] 2/9 12/06/2017 02/04/2014  Decreased Interest 0 0  Down, Depressed, Hopeless 0 0  PHQ - 2 Score 0 0       Assessment/plan:      No follow-ups on file.     @[facility] @ 12/06/2017

## 2017-12-26 ENCOUNTER — Encounter: Payer: BLUE CROSS/BLUE SHIELD | Admitting: Family Medicine

## 2018-03-28 ENCOUNTER — Other Ambulatory Visit: Payer: Self-pay | Admitting: Family Medicine

## 2018-04-05 ENCOUNTER — Other Ambulatory Visit: Payer: Self-pay | Admitting: Family Medicine

## 2018-04-05 DIAGNOSIS — Z1231 Encounter for screening mammogram for malignant neoplasm of breast: Secondary | ICD-10-CM

## 2018-05-01 ENCOUNTER — Ambulatory Visit: Payer: BLUE CROSS/BLUE SHIELD

## 2018-08-12 ENCOUNTER — Ambulatory Visit: Payer: Self-pay | Admitting: *Deleted

## 2018-08-12 NOTE — Telephone Encounter (Signed)
Please call patient to get more info or set up virtual visit for further advice.

## 2018-08-12 NOTE — Telephone Encounter (Signed)
I got a call from my daughter's orthodontists that she was exposed someone positive in the office.  I was wondering if I need to get tested for COVID-19.   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    I'm 4 hours out of town right now.    She requested if Dr. Rogers Blocker would give her a call [phone] so she can discuss it in more detail especially since she is out of town right now.  I sent this request high priority to Dr. Shelby Mattocks office.  Reason for Disposition . COVID-19 Testing, questions about  Answer Assessment - Initial Assessment Questions 1. COVID-19 DIAGNOSIS: "Who made your Coronavirus (COVID-19) diagnosis?" "Was it confirmed by a positive lab test?" If not diagnosed by a HCP, ask "Are there lots of cases (community spread) where you live?" (See public health department website, if unsure)     Yes 2. ONSET: "When did the COVID-19 symptoms start?"      My daughter was exposed at the orthodontist's office by a positive employee.  She was seen last Wed.  Daughter without symptoms. 3. WORST SYMPTOM: "What is your worst symptom?" (e.g., cough, fever, shortness of breath, muscle aches)     No symptoms 4. COUGH: "Do you have a cough?" If so, ask: "How bad is the cough?"       No 5. FEVER: "Do you have a fever?" If so, ask: "What is your temperature, how was it measured, and when did it start?"     No 6. RESPIRATORY STATUS: "Describe your breathing?" (e.g., shortness of breath, wheezing, unable to speak)      o 7. BETTER-SAME-WORSE: "Are you getting better, staying the same or getting worse compared to yesterday?"  If getting worse, ask, "In what way?"     No symptoms 8. HIGH RISK DISEASE: "Do you have any chronic medical problems?" (e.g., asthma, heart or lung disease, weak immune system, etc.)     There is a baby involved.   9. PREGNANCY: "Is there any chance you are pregnant?" "When was your last menstrual period?"     Not  asked  10. OTHER SYMPTOMS: "Do you have any other symptoms?"  (e.g., chills, fatigue, headache, loss of smell or taste, muscle pain, sore throat)       No  Protocols used: CORONAVIRUS (COVID-19) DIAGNOSED OR SUSPECTED-A-AH

## 2018-08-13 DIAGNOSIS — Z20828 Contact with and (suspected) exposure to other viral communicable diseases: Secondary | ICD-10-CM | POA: Diagnosis not present

## 2018-08-13 NOTE — Telephone Encounter (Signed)
Called and spoke to pt.  She and her daughter have decided to go get tested in [place] due to their current location.  Neither pt nor daughter have any symptoms but pt interacts w/ her very small grandchildren on a regular basis and wants to be safe.  Gave pt [facility] fax number and advised her to ask that testing site fax her results to [facility].  Pt verbalizes understanding. Also advise pt to contact our office if she does start to have any symptoms and inform her that we can schedule her for a virtual visit.

## 2018-10-18 ENCOUNTER — Ambulatory Visit
Admission: RE | Admit: 2018-10-18 | Discharge: 2018-10-18 | Disposition: A | Discharge status: Home / Self Care | Payer: BLUE CROSS/BLUE SHIELD | Source: Ambulatory Visit | Attending: Family Medicine | Admitting: Family Medicine

## 2018-10-18 ENCOUNTER — Other Ambulatory Visit: Payer: Self-pay

## 2018-10-18 DIAGNOSIS — Z1231 Encounter for screening mammogram for malignant neoplasm of breast: Secondary | ICD-10-CM

## 2019-07-03 ENCOUNTER — Encounter: Payer: Self-pay | Admitting: Family Medicine

## 2019-10-22 ENCOUNTER — Encounter: Payer: BC Managed Care – PPO | Admitting: Family Medicine

## 2019-11-07 ENCOUNTER — Other Ambulatory Visit: Payer: Self-pay | Admitting: Family Medicine

## 2019-11-07 DIAGNOSIS — Z1231 Encounter for screening mammogram for malignant neoplasm of breast: Secondary | ICD-10-CM

## 2019-11-12 ENCOUNTER — Encounter: Payer: BC Managed Care – PPO | Admitting: Family Medicine

## 2019-11-19 ENCOUNTER — Encounter: Payer: Self-pay | Admitting: Family Medicine

## 2019-11-19 ENCOUNTER — Other Ambulatory Visit: Payer: Self-pay

## 2019-11-19 ENCOUNTER — Ambulatory Visit (INDEPENDENT_AMBULATORY_CARE_PROVIDER_SITE_OTHER): Payer: BC Managed Care – PPO | Admitting: Family Medicine

## 2019-11-19 VITALS — BP 112/76 | HR 75 | Temp 98.0°F | Ht 64.0 in | Wt 156.8 lb

## 2019-11-19 DIAGNOSIS — Z23 Encounter for immunization: Secondary | ICD-10-CM | POA: Diagnosis not present

## 2019-11-19 DIAGNOSIS — Z0000 Encounter for general adult medical examination without abnormal findings: Secondary | ICD-10-CM | POA: Diagnosis not present

## 2019-11-19 NOTE — Patient Instructions (Addendum)
utd on your HM EXCEPT for pap smear. Come back in about 6 months! So good to see you! Look great really work hard on exercise!   Preventive Care 27-57 Years Old, Female Preventive care refers to visits with your health care provider and lifestyle choices that can promote health and wellness. This includes:  A yearly physical exam. This may also be called an annual well check.  Regular dental visits and eye exams.  Immunizations.  Screening for certain conditions.  Healthy lifestyle choices, such as eating a healthy diet, getting regular exercise, not using drugs or products that contain nicotine and tobacco, and limiting alcohol use. What can I expect for my preventive care visit? Physical exam Your health care provider will check your:  Height and weight. This may be used to calculate body mass index (BMI), which tells if you are at a healthy weight.  Heart rate and blood pressure.  Skin for abnormal spots. Counseling Your health care provider may ask you questions about your:  Alcohol, tobacco, and drug use.  Emotional well-being.  Home and relationship well-being.  Sexual activity.  Eating habits.  Work and work Statistician.  Method of birth control.  Menstrual cycle.  Pregnancy history. What immunizations do I need?  Influenza (flu) vaccine  This is recommended every year. Tetanus, diphtheria, and pertussis (Tdap) vaccine  You may need a Td booster every 10 years. Varicella (chickenpox) vaccine  You may need this if you have not been vaccinated. Zoster (shingles) vaccine  You may need this after age 81. Measles, mumps, and rubella (MMR) vaccine  You may need at least one dose of MMR if you were born in 1957 or later. You may also need a second dose. Pneumococcal conjugate (PCV13) vaccine  You may need this if you have certain conditions and were not previously vaccinated. Pneumococcal polysaccharide (PPSV23) vaccine  You may need one or two doses  if you smoke cigarettes or if you have certain conditions. Meningococcal conjugate (MenACWY) vaccine  You may need this if you have certain conditions. Hepatitis A vaccine  You may need this if you have certain conditions or if you travel or work in places where you may be exposed to hepatitis A. Hepatitis B vaccine  You may need this if you have certain conditions or if you travel or work in places where you may be exposed to hepatitis B. Haemophilus influenzae type b (Hib) vaccine  You may need this if you have certain conditions. Human papillomavirus (HPV) vaccine  If recommended by your health care provider, you may need three doses over 6 months. You may receive vaccines as individual doses or as more than one vaccine together in one shot (combination vaccines). Talk with your health care provider about the risks and benefits of combination vaccines. What tests do I need? Blood tests  Lipid and cholesterol levels. These may be checked every 5 years, or more frequently if you are over 60 years old.  Hepatitis C test.  Hepatitis B test. Screening  Lung cancer screening. You may have this screening every year starting at age 73 if you have a 30-pack-year history of smoking and currently smoke or have quit within the past 15 years.  Colorectal cancer screening. All adults should have this screening starting at age 26 and continuing until age 86. Your health care provider may recommend screening at age 83 if you are at increased risk. You will have tests every 1-10 years, depending on your results and the type  of screening test.  Diabetes screening. This is done by checking your blood sugar (glucose) after you have not eaten for a while (fasting). You may have this done every 1-3 years.  Mammogram. This may be done every 1-2 years. Talk with your health care provider about when you should start having regular mammograms. This may depend on whether you have a family history of breast  cancer.  BRCA-related cancer screening. This may be done if you have a family history of breast, ovarian, tubal, or peritoneal cancers.  Pelvic exam and Pap test. This may be done every 3 years starting at age 61. Starting at age 19, this may be done every 5 years if you have a Pap test in combination with an HPV test. Other tests  Sexually transmitted disease (STD) testing.  Bone density scan. This is done to screen for osteoporosis. You may have this scan if you are at high risk for osteoporosis. Follow these instructions at home: Eating and drinking  Eat a diet that includes fresh fruits and vegetables, whole grains, lean protein, and low-fat dairy.  Take vitamin and mineral supplements as recommended by your health care provider.  Do not drink alcohol if: ? Your health care provider tells you not to drink. ? You are pregnant, may be pregnant, or are planning to become pregnant.  If you drink alcohol: ? Limit how much you have to 0-1 drink a day. ? Be aware of how much alcohol is in your drink. In the [place] one drink equals one 12 oz bottle of beer (355 mL), one 5 oz glass of wine (148 mL), or one 1 oz glass of hard liquor (44 mL). Lifestyle  Take daily care of your teeth and gums.  Stay active. Exercise for at least 30 minutes on 5 or more days each week.  Do not use any products that contain nicotine or tobacco, such as cigarettes, e-cigarettes, and chewing tobacco. If you need help quitting, ask your health care provider.  If you are sexually active, practice safe sex. Use a condom or other form of birth control (contraception) in order to prevent pregnancy and STIs (sexually transmitted infections).  If told by your health care provider, take low-dose aspirin daily starting at age 28. What's next?  Visit your health care provider once a year for a well check visit.  Ask your health care provider how often you should have your eyes and teeth checked.  Stay up to date  on all vaccines. This information is not intended to replace advice given to you by your health care provider. Make sure you discuss any questions you have with your health care provider. Document Revised: 10/11/2017 Document Reviewed: 10/11/2017 Elsevier Patient Education  2020 [employer].

## 2019-11-19 NOTE — Progress Notes (Signed)
Patient: Jessica Hickman MRN: [id number] DOB: Apr 28, 1960 PCP: Orland Mustard, MD     Subjective:  Chief Complaint  Patient presents with  . Annual Exam     HPI: The patient is a 59 y.o. female who presents today for annual exam. She denies any changes to past medical history. There have been no recent hospitalizations. They are not following a well balanced diet and exercise plan. Weight has been increasing steadily. Pt complains of Arthritis in a finger. She is not exercising like she should, but she is very active.    Has pain in her right pointer finger. She has a little node on the finger. It doesn't hurt her at all. She is right handed.    Immunization History  Administered Date(s) Administered  . DTaP 02/13/2005  . Influenza,inj,Quad PF,6+ Mos 12/25/2016, 12/06/2017  . Tdap 12/25/2016   Colonoscopy: 05/29/2014 Mammogram: scheduled 12/09/2019 Pap smear: needs this.   Has had covid vaccinations. Wants flu shot today.   Review of Systems  Constitutional: Negative for chills, fatigue and fever.  HENT: Negative for congestion, dental problem, ear pain, hearing loss, sore throat and trouble swallowing.   Eyes: Negative for visual disturbance.  Respiratory: Negative for cough, chest tightness, shortness of breath and wheezing.   Cardiovascular: Negative for chest pain, palpitations and leg swelling.  Gastrointestinal: Negative for abdominal pain, blood in stool, diarrhea and nausea.  Endocrine: Negative for cold intolerance, polydipsia, polyphagia and polyuria.  Genitourinary: Negative for dysuria and hematuria.  Musculoskeletal: Negative for arthralgias.  Skin: Negative for rash.  Neurological: Negative for dizziness and headaches.  Psychiatric/Behavioral: Negative for dysphoric mood and sleep disturbance. The patient is not nervous/anxious.     Allergies Patient has No Known Allergies.  Past Medical History Patient  has a past medical history of Arthritis and  Hemorrhoids.  Surgical History Patient  has a past surgical history that includes breast biopsy for benign disease (2008) and Breast biopsy (Left).  Family History Pateint's family history includes Breast cancer in her paternal aunt; Diabetes in her father; Heart disease in her father; High blood pressure in her mother.  Social History Patient  reports that she has never smoked. She has never used smokeless tobacco. She reports current alcohol use of about 3.0 standard drinks of alcohol per week. She reports that she does not use drugs.    Objective: Vitals:   11/19/19 0904  BP: 112/76  Pulse: 75  Temp: 98 F (36.7 C)  TempSrc: Temporal  SpO2: 97%  Weight: 156 lb 12.8 oz (71.1 kg)  Height: 5\' 4"  (1.626 m)    Body mass index is 26.91 kg/m.  Physical Exam Vitals reviewed.  Constitutional:      Appearance: She is well-developed.  HENT:     Right Ear: External ear normal.     Left Ear: External ear normal.     Mouth/Throat:     Mouth: Mucous membranes are moist.  Eyes:     Conjunctiva/sclera: Conjunctivae normal.     Pupils: Pupils are equal, round, and reactive to light.  Neck:     Thyroid: No thyromegaly.  Cardiovascular:     Rate and Rhythm: Normal rate and regular rhythm.     Heart sounds: Normal heart sounds. No murmur heard.   Pulmonary:     Effort: Pulmonary effort is normal.     Breath sounds: Normal breath sounds.  Abdominal:     General: Abdomen is flat. Bowel sounds are normal. There is no distension.  Palpations: Abdomen is soft.     Tenderness: There is no abdominal tenderness.  Musculoskeletal:     Cervical back: Normal range of motion and neck supple.     Comments: herbeden nodes on right 2nd digit DIP joint   Lymphadenopathy:     Cervical: No cervical adenopathy.  Skin:    General: Skin is warm and dry.     Capillary Refill: Capillary refill takes less than 2 seconds.     Findings: No rash.  Neurological:     General: No focal deficit  present.     Mental Status: She is alert and oriented to person, place, and time.     Cranial Nerves: No cranial nerve deficit.     Coordination: Coordination normal.     Deep Tendon Reflexes: Reflexes normal.  Psychiatric:        Behavior: Behavior normal.      Office Visit from 11/19/2019 in [facility]  PHQ-2 Total Score 0          Assessment/plan: 1. Annual physical exam Routine fasting labs today. Has paperwork for insurance for me to fill out as well. HM reviewed. Needs pap smear and will come back for this. Has had covid vaccines. Encouraged more regular exercise. Overall doing well.  Patient counseling [x]    Nutrition: Stressed importance of moderation in sodium/caffeine intake, saturated fat and cholesterol, caloric balance, sufficient intake of fresh fruits, vegetables, fiber, calcium, iron, and 1 mg of folate supplement per day (for females capable of pregnancy).  [x]    Stressed the importance of regular exercise.   []    Substance Abuse: Discussed cessation/primary prevention of tobacco, alcohol, or other drug use; driving or other dangerous activities under the influence; availability of treatment for abuse.   [x]    Injury prevention: Discussed safety belts, safety helmets, smoke detector, smoking near bedding or upholstery.   [x]    Sexuality: Discussed sexually transmitted diseases, partner selection, use of condoms, avoidance of unintended pregnancy  and contraceptive alternatives.  [x]    Dental health: Discussed importance of regular tooth brushing, flossing, and dental visits.  [x]    Health maintenance and immunizations reviewed. Please refer to Health maintenance section.    - CBC with Differential/Platelet; Future - TSH; Future - Lipid panel; Future - COMPLETE METABOLIC PANEL WITH GFR; Future - COMPLETE METABOLIC PANEL WITH GFR - CBC with Differential/Platelet - TSH - Lipid panel  2. Need for immunization against influenza  - Flu  Vaccine QUAD 36+ mos IM    This visit occurred during the SARS-CoV-2 public health emergency.  Safety protocols were in place, including screening questions prior to the visit, additional usage of staff PPE, and extensive cleaning of exam room while observing appropriate contact time as indicated for disinfecting solutions.     Return in about 6 months (around 05/19/2020) for pap smear .     , MD [place] [facility]  11/19/2019

## 2019-11-20 LAB — CBC WITH DIFFERENTIAL/PLATELET
Absolute Monocytes: 442 cells/uL (ref 200–950)
Basophils Absolute: 39 cells/uL (ref 0–200)
Basophils Relative: 0.7 %
Eosinophils Absolute: 73 cells/uL (ref 15–500)
Eosinophils Relative: 1.3 %
HCT: 42.2 % (ref 35.0–45.0)
Hemoglobin: 14.3 g/dL (ref 11.7–15.5)
Lymphs Abs: 2240 cells/uL (ref 850–3900)
MCH: 30.6 pg (ref 27.0–33.0)
MCHC: 33.9 g/dL (ref 32.0–36.0)
MCV: 90.4 fL (ref 80.0–100.0)
MPV: 9.6 fL (ref 7.5–12.5)
Monocytes Relative: 7.9 %
Neutro Abs: 2806 cells/uL (ref 1500–7800)
Neutrophils Relative %: 50.1 %
Platelets: 256 10*3/uL (ref 140–400)
RBC: 4.67 10*6/uL (ref 3.80–5.10)
RDW: 12.5 % (ref 11.0–15.0)
Total Lymphocyte: 40 %
WBC: 5.6 10*3/uL (ref 3.8–10.8)

## 2019-11-20 LAB — LIPID PANEL
Cholesterol: 235 mg/dL — ABNORMAL HIGH (ref <200)
HDL: 68 mg/dL (ref >=50)
LDL Cholesterol (Calc): 147 mg/dL (calc) — ABNORMAL HIGH
Non-HDL Cholesterol (Calc): 167 mg/dL (calc) — ABNORMAL HIGH (ref <130)
Total CHOL/HDL Ratio: 3.5 (calc) (ref <5.0)
Triglycerides: 96 mg/dL (ref <150)

## 2019-11-20 LAB — COMPLETE METABOLIC PANEL WITH GFR
AG Ratio: 1.8 (calc) (ref 1.0–2.5)
ALT: 15 U/L (ref 6–29)
AST: 17 U/L (ref 10–35)
Albumin: 4.4 g/dL (ref 3.6–5.1)
Alkaline phosphatase (APISO): 59 U/L (ref 37–153)
BUN: 16 mg/dL (ref 7–25)
CO2: 29 mmol/L (ref 20–32)
Calcium: 9.5 mg/dL (ref 8.6–10.4)
Chloride: 104 mmol/L (ref 98–110)
Creat: 0.79 mg/dL (ref 0.50–1.05)
GFR, Est African American: 96 mL/min/{1.73_m2} (ref >=60)
GFR, Est Non African American: 83 mL/min/{1.73_m2} (ref >=60)
Globulin: 2.5 g/dL (calc) (ref 1.9–3.7)
Glucose, Bld: 105 mg/dL — ABNORMAL HIGH (ref 65–99)
Potassium: 4.3 mmol/L (ref 3.5–5.3)
Sodium: 140 mmol/L (ref 135–146)
Total Bilirubin: 0.4 mg/dL (ref 0.2–1.2)
Total Protein: 6.9 g/dL (ref 6.1–8.1)

## 2019-11-20 LAB — TSH: TSH: 1.94 mIU/L (ref 0.40–4.50)

## 2019-11-26 ENCOUNTER — Ambulatory Visit: Payer: BC Managed Care – PPO

## 2019-12-09 ENCOUNTER — Other Ambulatory Visit: Payer: Self-pay

## 2019-12-09 ENCOUNTER — Ambulatory Visit
Admission: RE | Admit: 2019-12-09 | Discharge: 2019-12-09 | Disposition: A | Discharge status: Home / Self Care | Payer: BC Managed Care – PPO | Source: Ambulatory Visit | Attending: Family Medicine | Admitting: Family Medicine

## 2019-12-09 DIAGNOSIS — Z1231 Encounter for screening mammogram for malignant neoplasm of breast: Secondary | ICD-10-CM

## 2020-10-26 ENCOUNTER — Other Ambulatory Visit: Payer: Self-pay | Admitting: Family Medicine

## 2020-10-26 ENCOUNTER — Telehealth: Payer: Self-pay

## 2020-10-26 DIAGNOSIS — Z1231 Encounter for screening mammogram for malignant neoplasm of breast: Secondary | ICD-10-CM

## 2020-10-26 NOTE — Telephone Encounter (Signed)
[name] called requesting to transfer from Dr Artis Flock to Dr Durene Cal because her husband Jessica Hickman see him. [name] wants a phyiscal with Dr Durene Cal the first week of November. I informed patient that Dr Erasmo Leventhal physicals are booked out until January. Please Advise.

## 2020-10-26 NOTE — Telephone Encounter (Signed)
See below, ok for pt to wait for CPE until Jan or does she need a TOC first?

## 2020-10-26 NOTE — Telephone Encounter (Signed)
I think we should be able to do those (TOC/CPE) together. Schedule next available and put her on cancellation list if we have a 40 min cancellation sooner.

## 2020-10-26 NOTE — Telephone Encounter (Signed)
Called and left message for patient to call back.

## 2020-10-26 NOTE — Telephone Encounter (Signed)
See below

## 2020-12-09 ENCOUNTER — Ambulatory Visit
Admission: RE | Admit: 2020-12-09 | Discharge: 2020-12-09 | Disposition: A | Discharge status: Home / Self Care | Payer: BC Managed Care – PPO | Source: Ambulatory Visit | Attending: Family Medicine | Admitting: Family Medicine

## 2020-12-09 ENCOUNTER — Other Ambulatory Visit: Payer: Self-pay

## 2020-12-09 DIAGNOSIS — Z1231 Encounter for screening mammogram for malignant neoplasm of breast: Secondary | ICD-10-CM

## 2021-03-30 LAB — RESULTS CONSOLE HPV: CHL HPV: NEGATIVE

## 2021-03-30 LAB — HM PAP SMEAR

## 2021-04-18 NOTE — Progress Notes (Incomplete)
?  Phone: [phone] ?  ?Subjective:  ?Patient presents today to establish care.  Prior patient of ***.  ?No chief complaint on file. ? ? ?See problem oriented charting ? ?The following were reviewed and entered/updated in epic: ?Past Medical History:  ?Diagnosis Date  ? Arthritis   ? hip  ? Hemorrhoids   ? ?Patient Active Problem List  ? Diagnosis Date Noted  ? INTERNAL HEMORRHOIDS WITHOUT MENTION COMP 12/06/2009  ? ?Past Surgical History:  ?Procedure Laterality Date  ? BREAST BIOPSY Left   ? breast biopsy for benign disease  2008  ? ? ?Family History  ?Problem Relation Age of Onset  ? High blood pressure Mother   ? Diabetes Father   ? Heart disease Father   ? Breast cancer Paternal Aunt   ? Colon cancer Neg Hx   ? ? ?Medications- reviewed and updated ?No current outpatient medications on file.  ? ?No current facility-administered medications for this visit.  ? ? ?Allergies-reviewed and updated ?No Known Allergies ? ?Social History  ? ?Social History Narrative  ? Not on file  ? ? ?Objective  ?Objective:  ?LMP 02/14/2012 (LMP Unknown)  ?Gen: NAD, resting comfortably ?HEENT: Mucous membranes are moist. Oropharynx normal. TM normal. ?Eyes: sclera and lids normal, PERRLA ?Neck: no thyromegaly, no cervical lymphadenopathy ?CV: RRR no murmurs rubs or gallops ?Lungs: CTAB no crackles, wheeze, rhonchi ?Abdomen: soft/nontender/nondistended/normal bowel sounds. No rebound or guarding.  ?Ext: no edema ?Skin: warm, dry ?Neuro: 5/5 strength in upper and lower extremities, normal gait, normal reflexes ?*** ?  ?Assessment and Plan:  ? ?@SPECCOMM @ ? ?No problem-specific Assessment & Plan notes found for this encounter. ? ? ? Recommended follow up: ***No follow-ups on file. ?Future Appointments  ?Date Time Provider Department Center  ?05/10/2021 10:40 AM 05/12/2021, Durene Cal, MD [facility]  ? ? ?No orders of the defined types were placed in this encounter. ? ? ?Time Spent: ?*** minutes of total time (2:35 PM***- 2:35 PM***) was  spent on the date of the encounter performing the following actions: chart review prior to seeing the patient, obtaining history, performing a medically necessary exam, counseling on the treatment plan, placing orders, and documenting in our EHR.  ? ?Return precautions advised. ?Aldine Contes ? ?

## 2021-05-10 ENCOUNTER — Ambulatory Visit (INDEPENDENT_AMBULATORY_CARE_PROVIDER_SITE_OTHER): Payer: BC Managed Care – PPO | Admitting: Family Medicine

## 2021-05-10 ENCOUNTER — Encounter: Payer: Self-pay | Admitting: Family Medicine

## 2021-05-10 VITALS — BP 120/72 | HR 73 | Temp 97.6°F | Ht 64.0 in | Wt 144.0 lb

## 2021-05-10 DIAGNOSIS — Z0000 Encounter for general adult medical examination without abnormal findings: Secondary | ICD-10-CM | POA: Diagnosis not present

## 2021-05-10 DIAGNOSIS — E785 Hyperlipidemia, unspecified: Secondary | ICD-10-CM

## 2021-05-10 DIAGNOSIS — R739 Hyperglycemia, unspecified: Secondary | ICD-10-CM

## 2021-05-10 LAB — CBC WITH DIFFERENTIAL/PLATELET
Basophils Absolute: 0 10*3/uL (ref 0.0–0.1)
Basophils Relative: 0.8 % (ref 0.0–3.0)
Eosinophils Absolute: 0 10*3/uL (ref 0.0–0.7)
Eosinophils Relative: 0.8 % (ref 0.0–5.0)
HCT: 41.6 % (ref 36.0–46.0)
Hemoglobin: 13.9 g/dL (ref 12.0–15.0)
Lymphocytes Relative: 44.4 % (ref 12.0–46.0)
Lymphs Abs: 2.5 10*3/uL (ref 0.7–4.0)
MCHC: 33.4 g/dL (ref 30.0–36.0)
MCV: 90 fl (ref 78.0–100.0)
Monocytes Absolute: 0.4 10*3/uL (ref 0.1–1.0)
Monocytes Relative: 6.8 % (ref 3.0–12.0)
Neutro Abs: 2.7 10*3/uL (ref 1.4–7.7)
Neutrophils Relative %: 47.2 % (ref 43.0–77.0)
Platelets: 253 10*3/uL (ref 150.0–400.0)
RBC: 4.63 Mil/uL (ref 3.87–5.11)
RDW: 13.3 % (ref 11.5–15.5)
WBC: 5.6 10*3/uL (ref 4.0–10.5)

## 2021-05-10 LAB — LIPID PANEL
Cholesterol: 212 mg/dL — ABNORMAL HIGH (ref 0–200)
HDL: 70.3 mg/dL (ref >39.00)
LDL Cholesterol: 124 mg/dL — ABNORMAL HIGH (ref 0–99)
NonHDL: 142.15
Total CHOL/HDL Ratio: 3
Triglycerides: 90 mg/dL (ref 0.0–149.0)
VLDL: 18 mg/dL (ref 0.0–40.0)

## 2021-05-10 LAB — COMPREHENSIVE METABOLIC PANEL
ALT: 14 U/L (ref 0–35)
AST: 17 U/L (ref 0–37)
Albumin: 4.8 g/dL (ref 3.5–5.2)
Alkaline Phosphatase: 55 U/L (ref 39–117)
BUN: 15 mg/dL (ref 6–23)
CO2: 31 mEq/L (ref 19–32)
Calcium: 10.1 mg/dL (ref 8.4–10.5)
Chloride: 103 mEq/L (ref 96–112)
Creatinine, Ser: 0.81 mg/dL (ref 0.40–1.20)
GFR: 79.95 mL/min (ref >60.00)
Glucose, Bld: 109 mg/dL — ABNORMAL HIGH (ref 70–99)
Potassium: 4.2 mEq/L (ref 3.5–5.1)
Sodium: 143 mEq/L (ref 135–145)
Total Bilirubin: 0.6 mg/dL (ref 0.2–1.2)
Total Protein: 7.1 g/dL (ref 6.0–8.3)

## 2021-05-10 LAB — HEMOGLOBIN A1C: Hgb A1c MFr Bld: 6.3 % (ref 4.6–6.5)

## 2021-05-10 NOTE — Patient Instructions (Addendum)
Sign release of information at the check out desk for PAP smear from [facility] [place] ? ?We will call you within two weeks about your referral to ct cardiac scoring. If you do not hear within 2 weeks, give [place] a call.  ? ? ?Team please log her 4 covid shots- she has a card ? ?Please stop by lab before you go ?If you have mychart- we will send your results within 3 business days of [place] receiving them.  ?If you do not have mychart- we will call you about results within 5 business days of [place] receiving them.  ?*please also note that you will see labs on mychart as soon as they post. I will later go in and write notes on them- will say "notes from Dr. Durene Cal"  ? ?Recommended follow up: Return in about 1 year (around 05/11/2022) for physical or sooner if needed.Schedule b4 you leave.  ?

## 2021-05-10 NOTE — Progress Notes (Signed)
?Phone: [phone] ?  ?Subjective:  ?Patient presents today to establish care with me as their new primary care provider. Patient was formerly a patient of Dr. Artis Flock.  ?Chief Complaint  ?Patient presents with  ? Transitions Of Care  ? Annual Exam-physical today  ? ?Review of Systems  ?Constitutional:  Negative for chills and fever.  ?HENT:  Negative for hearing loss and nosebleeds.   ?Eyes:  Negative for double vision and photophobia.  ?Respiratory:  Negative for cough and shortness of breath.   ?Cardiovascular:  Negative for chest pain and palpitations.  ?Gastrointestinal:  Negative for abdominal pain, blood in stool, constipation, diarrhea, heartburn, melena, nausea and vomiting.  ?Genitourinary:  Negative for dysuria and frequency.  ?Musculoskeletal:  Negative for back pain, myalgias and neck pain.  ?Skin:  Negative for itching and rash.  ?Neurological:  Negative for dizziness and headaches.  ?Endo/Heme/Allergies:  Negative for polydipsia. Does not bruise/bleed easily.  ?Psychiatric/Behavioral:  Negative for depression and suicidal ideas.   ? ?See problem oriented charting ? ?The following were reviewed and entered/updated in epic: ?Past Medical History:  ?Diagnosis Date  ? Arthritis   ? hip  ? Hemorrhoids   ? ?Patient Active Problem List  ? Diagnosis Date Noted  ? INTERNAL HEMORRHOIDS WITHOUT MENTION COMP 12/06/2009  ? ?Past Surgical History:  ?Procedure Laterality Date  ? breast biopsy for benign disease Left 2008  ? ? ?Family History  ?Problem Relation Age of Onset  ? High blood pressure Mother   ? Stroke Mother   ? Diabetes Mother   ?     10s  ? Diabetes Father   ?     55s  ? Heart disease Father   ?     early 62s  ? Diabetes Sister   ? Hyperlipidemia Sister   ? Stroke Brother   ?     around 40  ? Heart disease Maternal Grandmother   ?     90s  ? Aneurysm Maternal Grandfather   ?     brain 60s  ? Heart disease Paternal Grandmother   ?     died in 69s  ? Heart disease Paternal Grandfather   ?     90s  ?  Breast cancer Paternal Aunt   ? Heart disease Paternal Aunt   ?     died in 31s  ? Colon cancer Neg Hx   ? ? ?Medications- reviewed and updated ?No current outpatient medications on file.  ? ?No current facility-administered medications for this visit.  ? ? ?Allergies-reviewed and updated ?No Known Allergies ? ?Social History  ? ?Social History Narrative  ? Married. 5 kids ranging form 24 to 34 in 2022. 6 grandkids- with #7 on the way in 2023  ?   ? 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   ? Hobbies: reading, gardening, time with grandkids, fish with husband  ? ?Objective  ?Objective:  ?BP 120/72   Pulse 73   Temp 97.6 ?F (36.4 ?C)   Ht 5\' 4"  (1.626 m)   Wt 144 lb (65.3 kg)   LMP 02/14/2012 (LMP Unknown)   SpO2 98%   BMI 24.72 kg/m?  ?Gen: NAD, resting comfortably ?HEENT: Mucous membranes are moist. Oropharynx normal, right ear some cerumen, left TM normal ?Neck: no thyromegaly ?CV: RRR no murmurs rubs or gallops ?Lungs: CTAB no crackles, wheeze, rhonchi ?Abdomen: soft/nontender/nondistended/normal bowel sounds. No rebound or guarding.  ?Ext: no edema ?Skin: warm, dry ?Neuro: grossly normal, moves all extremities, PERRLA ? ? ?  ?  Assessment and Plan  ? ?61 y.o. female presenting for annual physical.  ?Health Maintenance counseling: ?1. Anticipatory guidance: Patient counseled regarding regular dental exams -q6 months, eye exams - yearly or so,  avoiding smoking and second hand smoke , limiting alcohol to 1 beverage per day- 2 per week , no illicit drugs.   ?2. Risk factor reduction:  Advised patient of need for regular exercise and diet rich and fruits and vegetables to reduce risk of heart attack and stroke.  ?Exercise- walks and goes to gym- at least 3-4 days a week.  ?Diet/weight management-down 12 lbs from last CPE- weight watchers went but didn't help much- felt cutting back more helpful.  ?Wt Readings from Last 3 Encounters:  ?05/10/21 144 lb (65.3 kg)  ?11/19/19 156 lb 12.8 oz (71.1 kg)  ?12/06/17 156  lb 6.4 oz (70.9 kg)  ?3. Immunizations/screenings/ancillary studies- had covid shot ?Immunization History  ?Administered Date(s) Administered  ? DTaP 02/13/2005  ? Influenza,inj,Quad PF,6+ Mos 12/25/2016, 12/06/2017, 11/19/2019, 11/27/2020  ? Tdap 12/25/2016  ?4. Cervical cancer screening- follows with Dr. Adalberto Ill- [facility] [place]- last done in February 15- we will get copy ?5. Breast cancer screening-  breast exam with GYN Dr. Adalberto Ill and mammogram 12/09/20 ?6. Colon cancer screening - 05/29/2014 with 10 year repeat planned ?7. Skin cancer screening- no dermatologist. advised regular sunscreen use. Denies worrisome, changing, or new skin lesions.  ?8. Birth control/STD check- postmenopause. monogamous ?9. Osteoporosis screening at 35- consider next year- has a lot going on right now at home- just lost mother in law ?10. Smoking associated screening - Never smoker ? ?Status of chronic or acute concerns  ? ?#hyperlipidemia with maternal side with a lot of CV history including mother with stroke, dad with heart disease in 59s, brother with stroke around 69 ?S: Medication:none  ?Lab Results  ?Component Value Date  ? CHOL 235 (H) 11/19/2019  ? HDL 68 11/19/2019  ? LDLCALC 147 (H) 11/19/2019  ? LDLDIRECT 124.3 12/17/2012  ? TRIG 96 11/19/2019  ? CHOLHDL 3.5 11/19/2019  ? A/P: interested in ct cardiac scoring as well as updating lipids to gain more information in making our decisions ? ?# Hyperglycemia/insulin resistance/prediabetes ?S:  Medication: none ?- fasting cbg 105 last visit ? A/P: check fasting cbg and a1c ? ?#MRI brain years ago due to tingling in feet- that was reassuring- still has some tingling if hot ? ?# Hip arthritis - 2 cortisone shots in past- takes something OTC at home ? ?Recommended follow up: Return in about 1 year (around 05/11/2022) for physical or sooner if needed.Schedule b4 you leave. ? ?Lab/Order associations: fasting ?  ICD-10-CM   ?1. Preventative health care  Z00.00 CBC with  Differential/Platelet  ?  Comprehensive metabolic panel  ?  Lipid panel  ?  ?2. Hyperlipidemia, unspecified hyperlipidemia type  E78.5 CBC with Differential/Platelet  ?  Comprehensive metabolic panel  ?  Lipid panel  ?  CT CARDIAC SCORING (SELF PAY ONLY)  ?  ? ?No orders of the defined types were placed in this encounter. ? ?Return precautions advised.  ?Tana Conch, MD ?

## 2021-06-20 ENCOUNTER — Other Ambulatory Visit: Payer: BC Managed Care – PPO

## 2021-07-01 ENCOUNTER — Ambulatory Visit
Admission: RE | Admit: 2021-07-01 | Discharge: 2021-07-01 | Disposition: A | Discharge status: Home / Self Care | Payer: Self-pay | Source: Ambulatory Visit | Attending: Family Medicine | Admitting: Family Medicine

## 2021-07-01 DIAGNOSIS — E785 Hyperlipidemia, unspecified: Secondary | ICD-10-CM

## 2021-11-07 ENCOUNTER — Encounter: Payer: Self-pay | Admitting: *Deleted

## 2022-01-26 ENCOUNTER — Encounter: Payer: Self-pay | Admitting: *Deleted

## 2022-07-03 ENCOUNTER — Encounter: Payer: BC Managed Care – PPO | Admitting: Family Medicine

## 2022-07-11 ENCOUNTER — Encounter: Payer: Self-pay | Admitting: Family Medicine

## 2022-07-11 ENCOUNTER — Ambulatory Visit (INDEPENDENT_AMBULATORY_CARE_PROVIDER_SITE_OTHER): Payer: BC Managed Care – PPO | Admitting: Family Medicine

## 2022-07-11 VITALS — BP 112/80 | HR 80 | Temp 97.2°F | Ht 64.0 in | Wt 155.6 lb

## 2022-07-11 DIAGNOSIS — E785 Hyperlipidemia, unspecified: Secondary | ICD-10-CM | POA: Diagnosis not present

## 2022-07-11 DIAGNOSIS — Z131 Encounter for screening for diabetes mellitus: Secondary | ICD-10-CM

## 2022-07-11 DIAGNOSIS — R739 Hyperglycemia, unspecified: Secondary | ICD-10-CM

## 2022-07-11 DIAGNOSIS — Z0000 Encounter for general adult medical examination without abnormal findings: Secondary | ICD-10-CM

## 2022-07-11 NOTE — Patient Instructions (Addendum)
Schedule a lab visit at the check out desk within 2 weeks. Return for future fasting labs meaning nothing but water after midnight please. Ok to take your medications with water.    Poor sleep can make it harder to lose weight- lets focus on that -try to have consistent bedtime within abotu 15 minutes for these 2 weeks - stop ALL screens 30 minutes before bed- do something without screens that is not too mentally stimulating- leisure reading, folding socks, time with pets - at the same time you stop screens start to turn the lights down -consider sleep mask - bed only for sleep and sex- otherwise try to get up (no lingering in the morning for instance)   Recommended follow up: Return in about 1 year (around 07/11/2023) for physical or sooner if needed.Schedule b4 you leave. -possibly 6 months depending on a1c and cholesterol

## 2022-07-11 NOTE — Progress Notes (Signed)
Phone [phone]   Subjective:  Patient presents today for their annual physical. Chief complaint-noted.   See problem oriented charting- ROS- full  review of systems was completed and negative Per full ROS sheet completed by patient- other than insomnia issues and joint issues  The following were reviewed and entered/updated in epic: Past Medical History:  Diagnosis Date   Arthritis    hip   Hemorrhoids    Patient Active Problem List   Diagnosis Date Noted   INTERNAL HEMORRHOIDS WITHOUT MENTION COMP 12/06/2009   Past Surgical History:  Procedure Laterality Date   breast biopsy for benign disease Left 2008   Family History  Problem Relation Age of Onset   High blood pressure Mother    Stroke Mother    Diabetes Mother        37s   Diabetes Father        6s   Heart disease Father        early 52s   Diabetes Sister    Hyperlipidemia Sister    Stroke Brother        around 42   Heart disease Maternal Grandmother        90s   Aneurysm Maternal Grandfather        brain 60s   Heart disease Paternal Grandmother        died in 18s   Heart disease Paternal Grandfather        90s   Breast cancer Paternal Aunt    Heart disease Paternal Aunt        died in 58s   Colon cancer Neg Hx     Medications- reviewed and updated No current outpatient medications on file.   No current facility-administered medications for this visit.    Allergies-reviewed and updated No Known Allergies  Social History   Social History Narrative   Married. 5 kids ranging form 24 to 34 in 2022. 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 Oropharynx normal Neck: no thyromegaly CV: RRR no murmurs rubs or gallops Lungs: CTAB no crackles, wheeze, rhonchi Abdomen: soft/nontender/nondistended/normal bowel sounds. No rebound or guarding.  Ext: no edema Skin: warm, dry Neuro: grossly normal, moves all extremities, PERRLA    Assessment and Plan   62 y.o. female presenting for annual physical.  Health Maintenance counseling: 1. Anticipatory guidance: Patient counseled regarding regular dental exams -q6 months, eye exams - yearly,  avoiding smoking and second hand smoke , limiting alcohol to 1 beverage per day- 2-3 a week usually , no illicit drugs .   2. Risk factor reduction:  Advised patient of need for regular exercise and diet rich and fruits and vegetables to reduce risk of heart attack and stroke.  Exercise- pool 2 days a week at [facility]- helping joints- also going 2 other days a week 30 minutes of cardiology and some weights.  Diet/weight management-weight up 11 lbs from last year- mild weight loss encouraged- would love within a year to see her back down at  144 - also should help reduce diabetes risk .  Wt Readings from Last 3 Encounters:  07/11/22 155 lb 9.6 oz (70.6 kg)  05/10/21 144 lb (65.3 kg)  11/19/19 156 lb 12.8 oz (71.1 kg)  3. Immunizations/screenings/ancillary studies-declines COVID and Shingrix vaccination  Immunization History  Administered Date(s) Administered   DTaP 02/13/2005   Influenza,inj,Quad PF,6+ Mos 12/25/2016, 12/06/2017, 11/19/2019, 11/27/2020   PFIZER(Purple Top)SARS-COV-2 Vaccination 04/28/2019, 05/20/2019, 12/31/2019, 07/06/2020   Tdap 12/25/2016  4. Cervical cancer screening- follows with Dr. Adalberto Ill- [facility] [place]- last done in March 30 2021  5. Breast cancer screening-  breast exam with GYN Dr. Adalberto Ill and mammogram - 12/09/20- also had one of these in 2023- we will get records  6. Colon cancer screening - 05/29/2014 with 10 year repeat planned  7.  Skin cancer screening- scheduled in July for dermatology- dermatology specialists.  advised regular sunscreen use. Denies worrisome, changing, or new skin lesions.  8. Birth control/STD check- postmenopause. Monogamous  9. Osteoporosis screening at 65-wants to hold for now  10. Smoking associated screening - Never smoker   Status of chronic or acute concerns   #social update- just got back from 3 weeks from [place]- a lot of good eating and carbs  #hyperlipidemia but thankfully ct cardiac scoring 0 on 07/01/21-done due to family history of heart attack in father in 35s, brother with stroke around 47 S: Medication: none  Lab Results  Component Value Date   CHOL 212 (H) 05/10/2021   HDL 70.30 05/10/2021   LDLCALC 124 (H) 05/10/2021   LDLDIRECT 124.3 12/17/2012   TRIG 90.0 05/10/2021   CHOLHDL 3 05/10/2021   A/P: holding off on medicine and try to focus on lifestyle- repeat 2028 CT calcium.     # Hyperglycemia/insulin resistance/prediabetes S:  Medication: none Lab Results  Component Value Date   HGBA1C 6.3 05/10/2021  A/P: hopefully stable- update a1c today. Continue without meds for now    # MRI brain done years ago due to tingling in the feet that was reassuring-patient still with some tingling sensation with heat- not hot enough yet to exerpience this year   # Right Hip arthritis-2 cortisone shots in the past since age 63-takes over-the-counter product- Turmeric and glucosamine helping. Very sparing NSAIDs in past- now up to every other day or every 3rd day (other than vacation)  #Left shoulder- frozen shoulder saw Dr. Althea Charon and required 2 injections (longer than 3 months ago).  - pool helping at [facility]  #sleep issues- tries melatonin or   Recommended follow up: Return in about 1 year (around 07/11/2023) for physical or sooner if needed.Schedule b4 you leave.  Lab/Order associations: Wants to return tomorrow for fasting labs   ICD-10-CM   1. Preventative health care   Z00.00     2. Hyperlipidemia, unspecified hyperlipidemia type  E78.5     3. Hyperglycemia  R73.9     4. Screening for diabetes mellitus  Z13.1       No orders of the defined types were placed in this encounter.   Return precautions advised.  Tana Conch, MD

## 2022-07-19 ENCOUNTER — Other Ambulatory Visit (INDEPENDENT_AMBULATORY_CARE_PROVIDER_SITE_OTHER): Payer: BC Managed Care – PPO

## 2022-07-19 DIAGNOSIS — R739 Hyperglycemia, unspecified: Secondary | ICD-10-CM | POA: Diagnosis not present

## 2022-07-19 DIAGNOSIS — E785 Hyperlipidemia, unspecified: Secondary | ICD-10-CM | POA: Diagnosis not present

## 2022-07-19 DIAGNOSIS — Z131 Encounter for screening for diabetes mellitus: Secondary | ICD-10-CM | POA: Diagnosis not present

## 2022-07-19 LAB — COMPREHENSIVE METABOLIC PANEL
ALT: 15 U/L (ref 0–35)
AST: 19 U/L (ref 0–37)
Albumin: 4.4 g/dL (ref 3.5–5.2)
Alkaline Phosphatase: 53 U/L (ref 39–117)
BUN: 19 mg/dL (ref 6–23)
CO2: 26 mEq/L (ref 19–32)
Calcium: 9.5 mg/dL (ref 8.4–10.5)
Chloride: 103 mEq/L (ref 96–112)
Creatinine, Ser: 0.76 mg/dL (ref 0.40–1.20)
GFR: 85.59 mL/min (ref >60.00)
Glucose, Bld: 95 mg/dL (ref 70–99)
Potassium: 4 mEq/L (ref 3.5–5.1)
Sodium: 142 mEq/L (ref 135–145)
Total Bilirubin: 0.6 mg/dL (ref 0.2–1.2)
Total Protein: 6.9 g/dL (ref 6.0–8.3)

## 2022-07-19 LAB — CBC WITH DIFFERENTIAL/PLATELET
Basophils Absolute: 0 10*3/uL (ref 0.0–0.1)
Basophils Relative: 0.9 % (ref 0.0–3.0)
Eosinophils Absolute: 0.1 10*3/uL (ref 0.0–0.7)
Eosinophils Relative: 1.8 % (ref 0.0–5.0)
HCT: 41.4 % (ref 36.0–46.0)
Hemoglobin: 13.9 g/dL (ref 12.0–15.0)
Lymphocytes Relative: 47.6 % — ABNORMAL HIGH (ref 12.0–46.0)
Lymphs Abs: 2.2 10*3/uL (ref 0.7–4.0)
MCHC: 33.5 g/dL (ref 30.0–36.0)
MCV: 90.2 fl (ref 78.0–100.0)
Monocytes Absolute: 0.5 10*3/uL (ref 0.1–1.0)
Monocytes Relative: 9.8 % (ref 3.0–12.0)
Neutro Abs: 1.9 10*3/uL (ref 1.4–7.7)
Neutrophils Relative %: 39.9 % — ABNORMAL LOW (ref 43.0–77.0)
Platelets: 268 10*3/uL (ref 150.0–400.0)
RBC: 4.59 Mil/uL (ref 3.87–5.11)
RDW: 13.3 % (ref 11.5–15.5)
WBC: 4.7 10*3/uL (ref 4.0–10.5)

## 2022-07-19 LAB — LIPID PANEL
Cholesterol: 227 mg/dL — ABNORMAL HIGH (ref 0–200)
HDL: 63.7 mg/dL (ref >39.00)
LDL Cholesterol: 142 mg/dL — ABNORMAL HIGH (ref 0–99)
NonHDL: 163.19
Total CHOL/HDL Ratio: 4
Triglycerides: 105 mg/dL (ref 0.0–149.0)
VLDL: 21 mg/dL (ref 0.0–40.0)

## 2022-07-19 LAB — TSH: TSH: 2.23 u[IU]/mL (ref 0.35–5.50)

## 2022-07-19 LAB — HEMOGLOBIN A1C: Hgb A1c MFr Bld: 6 % (ref 4.6–6.5)

## 2023-07-17 ENCOUNTER — Ambulatory Visit (INDEPENDENT_AMBULATORY_CARE_PROVIDER_SITE_OTHER): Payer: BC Managed Care – PPO | Admitting: Family Medicine

## 2023-07-17 ENCOUNTER — Encounter: Payer: Self-pay | Admitting: Family Medicine

## 2023-07-17 ENCOUNTER — Encounter: Payer: BC Managed Care – PPO | Admitting: Family Medicine

## 2023-07-17 VITALS — BP 118/80 | HR 97 | Temp 97.6°F | Ht 64.0 in | Wt 156.0 lb

## 2023-07-17 DIAGNOSIS — E785 Hyperlipidemia, unspecified: Secondary | ICD-10-CM | POA: Diagnosis not present

## 2023-07-17 DIAGNOSIS — Z131 Encounter for screening for diabetes mellitus: Secondary | ICD-10-CM | POA: Diagnosis not present

## 2023-07-17 DIAGNOSIS — R739 Hyperglycemia, unspecified: Secondary | ICD-10-CM | POA: Diagnosis not present

## 2023-07-17 DIAGNOSIS — Z0000 Encounter for general adult medical examination without abnormal findings: Secondary | ICD-10-CM | POA: Diagnosis not present

## 2023-07-17 NOTE — Progress Notes (Signed)
 Phone [phone]   Subjective:  Patient presents today for their annual physical. Chief complaint-noted.   See problem oriented charting- ROS- full  review of systems was completed and negative Per full ROS sheet completed by patient except for topics noted under acute/chronic concerns   The following were reviewed and entered/updated in epic: Past Medical History:  Diagnosis Date   Arthritis    hip   Hemorrhoids    Patient Active Problem List   Diagnosis Date Noted   Internal hemorrhoids 12/06/2009   Past Surgical History:  Procedure Laterality Date   breast biopsy for benign disease Left 2008    Family History  Problem Relation Age of Onset   High blood pressure Mother    Stroke Mother    Diabetes Mother        3s   Diabetes Father        55s   Heart disease Father        early 62s   Diabetes Sister    Hyperlipidemia Sister    Stroke Brother        around 49   Heart disease Maternal Grandmother        90s   Aneurysm Maternal Grandfather        brain 57s   Heart disease Paternal Grandmother        died in 73s   Heart disease Paternal Grandfather        90s   Breast cancer Paternal Aunt    Heart disease Paternal Aunt        died in 32s   Colon cancer Neg Hx     Medications- reviewed and updated Current Outpatient Medications  Medication Sig Dispense Refill   meloxicam (MOBIC) 15 MG tablet Take 15 mg by mouth as needed for pain.     No current facility-administered medications for this visit.    Allergies-reviewed and updated No Known Allergies  Social History   Social History Narrative   Married. 5 kids ranging form 24 to 34 in 2022. 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 Oropharynx normal Neck: no thyromegaly CV: RRR no murmurs rubs or gallops Lungs: CTAB no crackles, wheeze, rhonchi Abdomen: soft/nontender/nondistended/normal bowel sounds. No rebound or guarding.  Ext: no edema Skin: warm, dry Neuro: grossly normal, moves all extremities, PERRLA   Assessment and Plan   63 y.o. female presenting for annual physical.  Health Maintenance counseling: 1. Anticipatory guidance: Patient counseled regarding regular dental exams -q6 months, eye exams - yearly pretty much,  avoiding smoking and second hand smoke , limiting alcohol to 1 beverage per day- 3 per week , no illicit drugs .   2. Risk factor reduction:  Advised patient of need for regular exercise and diet rich and fruits and vegetables to reduce risk of heart attack and stroke.  Exercise- pool 2 days a week and then doing 2 other days cardiology/weights- unchanged from last year.  Diet/weight management-within 1 pounds of last year- she has desired mild weight loss also  to help with prediabetes and her hip.  Wt Readings from Last 3 Encounters:  07/17/23 156 lb (70.8 kg)  07/11/22 155 lb 9.6 oz (70.6 kg)  05/10/21 144 lb (65.3 kg)   3. Immunizations/screenings/ancillary studies- opts out further COVID, opts out shingrix Immunization History  Administered Date(s) Administered   DTaP 02/13/2005   Influenza,inj,Quad PF,6+ Mos 12/25/2016, 12/06/2017, 11/19/2019, 11/27/2020   PFIZER(Purple Top)SARS-COV-2 Vaccination 04/28/2019, 05/20/2019, 12/31/2019, 07/06/2020   Tdap 12/25/2016  4. Cervical cancer screening- follow up with Dr. Gina [facility]- last pap 03/30/21 ascus but HPV negative so plan was 1 year repeat- she plans to schedule 5. Breast cancer screening-  breast exam  with GYN typically and mammogram  last on file 11/2020 but she's pretty sure has had since then- is going to call breast  center to schedule 6. Colon cancer screening - May 29 2014 and will be due next year  7. Skin cancer screening- dermatology specialists within the year. advised regular sunscreen use. Denies worrisome, changing, or new skin lesions.  8. Birth control/STD check- postmenopausal and monogamous  9. Osteoporosis screening at 54- prefers at 67  10. Smoking associated screening - never smoker  Status of chronic or acute concerns   # Cough/congestion S:started Monday of last week- day 9 today. Has had some chunky green discharge. Occasional cough. Has had some bdy aches- dayquil and Nyquil helped some. Had an ear ache on the right last week then ringing since then. Took mucinex- D and helpd some. Helped pressure in the ear. Has had some chills- thinks may have been febrile as well. Some blood from nose and some from phlegm noted afterwards- likely drained down back.  -was extra tired - yesterday did COVID and flu and negative  -gets worse as day goes on - no shortness of breath , no wheezing, no chest pain  -denies sinus pressure or chest pressure. No sore throat A/P: suspect viral upper respiratory infection (URI)- discussed if sinus pressure develops would treat for sinusitis as will be day 10 tomorrow. Or also to let us  know if ear pain or shortness of breath etc- hoping otherwise to improve within a week with rest and hydration   #hyperlipidemia-  ct cardiac scoring 0 on 07/01/21 S: Medication:none  Lab Results  Component Value Date   CHOL 227 (H) 07/19/2022   HDL 63.70 07/19/2022   LDLCALC 142 (H) 07/19/2022   LDLDIRECT 124.3 12/17/2012   TRIG 105.0 07/19/2022   CHOLHDL 4 07/19/2022   A/P: lipids mildly high but with reassuring CT calicum likely recheck in 2028 and hold off on statin  # Hyperglycemia/insulin resistance/prediabetes S:  Medication: none Exercise and diet- see above Lab Results  Component Value Date   HGBA1C 6.0 07/19/2022   HGBA1C 6.3 05/10/2021  A/P: hopefully  stable- update a1c with labs. Continue without meds for now   #Hip pain on right- sparing meloxicam. Has seen Dr. Constancia Delton. Injections twice over 15 years- will eventually need replacement  Recommended follow up: Return in about 1 year (around 07/16/2024) for physical or sooner if needed.Schedule b4 you leave.  Lab/Order associations:will come back fasting   ICD-10-CM   1. Preventative health care  Z00.00     2. Hyperlipidemia, unspecified hyperlipidemia type  E78.5 Comprehensive metabolic panel with GFR    CBC with Differential/Platelet    Lipid panel    3. Hyperglycemia  R73.9 HgB A1c    4. Screening for diabetes mellitus  Z13.1 HgB A1c  No orders of the defined types were placed in this encounter.   Return precautions advised.  Clarisa Crooked, MD

## 2023-07-17 NOTE — Patient Instructions (Addendum)
 Schedule a lab visit at the check out desk within 2 weeks. Return for future fasting labs meaning nothing but water after midnight please. Ok to take your medications with water.   Trial debrox for the right ear- complete impaction of wax  I think you have a stubborn virus and these can last up to 2 weeks. If you develop ear pain, worsening sinus pressure, shortness of breath or simply fail to get better in next week or two please let me know  Schedule repeat GYN visit for pap smear  [facility] Schedule an appointment by calling [phone].  Recommended follow up: Return in about 1 year (around 07/16/2024) for physical or sooner if needed.Schedule b4 you leave.

## 2023-07-18 ENCOUNTER — Other Ambulatory Visit (INDEPENDENT_AMBULATORY_CARE_PROVIDER_SITE_OTHER)

## 2023-07-18 ENCOUNTER — Ambulatory Visit: Payer: Self-pay | Admitting: Family Medicine

## 2023-07-18 DIAGNOSIS — R739 Hyperglycemia, unspecified: Secondary | ICD-10-CM | POA: Diagnosis not present

## 2023-07-18 DIAGNOSIS — Z131 Encounter for screening for diabetes mellitus: Secondary | ICD-10-CM | POA: Diagnosis not present

## 2023-07-18 DIAGNOSIS — E785 Hyperlipidemia, unspecified: Secondary | ICD-10-CM | POA: Diagnosis not present

## 2023-07-18 LAB — COMPREHENSIVE METABOLIC PANEL WITH GFR
ALT: 21 U/L (ref 0–35)
AST: 19 U/L (ref 0–37)
Albumin: 4.2 g/dL (ref 3.5–5.2)
Alkaline Phosphatase: 70 U/L (ref 39–117)
BUN: 12 mg/dL (ref 6–23)
CO2: 28 meq/L (ref 19–32)
Calcium: 9.5 mg/dL (ref 8.4–10.5)
Chloride: 100 meq/L (ref 96–112)
Creatinine, Ser: 0.7 mg/dL (ref 0.40–1.20)
GFR: 93.8 mL/min (ref >60.00)
Glucose, Bld: 93 mg/dL (ref 70–99)
Potassium: 3.8 meq/L (ref 3.5–5.1)
Sodium: 138 meq/L (ref 135–145)
Total Bilirubin: 0.5 mg/dL (ref 0.2–1.2)
Total Protein: 7.4 g/dL (ref 6.0–8.3)

## 2023-07-18 LAB — CBC WITH DIFFERENTIAL/PLATELET
Basophils Absolute: 0.1 10*3/uL (ref 0.0–0.1)
Basophils Relative: 0.8 % (ref 0.0–3.0)
Eosinophils Absolute: 0.1 10*3/uL (ref 0.0–0.7)
Eosinophils Relative: 0.9 % (ref 0.0–5.0)
HCT: 38.7 % (ref 36.0–46.0)
Hemoglobin: 12.8 g/dL (ref 12.0–15.0)
Lymphocytes Relative: 28.2 % (ref 12.0–46.0)
Lymphs Abs: 2.6 10*3/uL (ref 0.7–4.0)
MCHC: 33.1 g/dL (ref 30.0–36.0)
MCV: 88.4 fl (ref 78.0–100.0)
Monocytes Absolute: 0.9 10*3/uL (ref 0.1–1.0)
Monocytes Relative: 9.4 % (ref 3.0–12.0)
Neutro Abs: 5.5 10*3/uL (ref 1.4–7.7)
Neutrophils Relative %: 60.7 % (ref 43.0–77.0)
Platelets: 360 10*3/uL (ref 150.0–400.0)
RBC: 4.38 Mil/uL (ref 3.87–5.11)
RDW: 13.8 % (ref 11.5–15.5)
WBC: 9.1 10*3/uL (ref 4.0–10.5)

## 2023-07-18 LAB — LIPID PANEL
Cholesterol: 174 mg/dL (ref 0–200)
HDL: 52.3 mg/dL (ref >39.00)
LDL Cholesterol: 105 mg/dL — ABNORMAL HIGH (ref 0–99)
NonHDL: 121.77
Total CHOL/HDL Ratio: 3
Triglycerides: 82 mg/dL (ref 0.0–149.0)
VLDL: 16.4 mg/dL (ref 0.0–40.0)

## 2023-07-18 LAB — HEMOGLOBIN A1C: Hgb A1c MFr Bld: 6.4 % (ref 4.6–6.5)

## 2023-07-19 ENCOUNTER — Other Ambulatory Visit: Payer: Self-pay | Admitting: Family Medicine

## 2023-07-19 DIAGNOSIS — Z1231 Encounter for screening mammogram for malignant neoplasm of breast: Secondary | ICD-10-CM

## 2023-07-24 ENCOUNTER — Other Ambulatory Visit: Payer: Self-pay

## 2023-07-24 DIAGNOSIS — Z780 Asymptomatic menopausal state: Secondary | ICD-10-CM

## 2023-07-24 NOTE — Telephone Encounter (Signed)
 Appt scheduled

## 2023-08-01 ENCOUNTER — Ambulatory Visit (INDEPENDENT_AMBULATORY_CARE_PROVIDER_SITE_OTHER)
Admission: RE | Admit: 2023-08-01 | Discharge: 2023-08-01 | Disposition: A | Discharge status: Home / Self Care | Source: Ambulatory Visit | Attending: Family Medicine | Admitting: Family Medicine

## 2023-08-01 DIAGNOSIS — Z780 Asymptomatic menopausal state: Secondary | ICD-10-CM | POA: Diagnosis not present

## 2023-08-02 ENCOUNTER — Ambulatory Visit: Payer: Self-pay | Admitting: Family Medicine

## 2023-08-08 ENCOUNTER — Ambulatory Visit

## 2023-08-29 ENCOUNTER — Ambulatory Visit
Admission: RE | Admit: 2023-08-29 | Discharge: 2023-08-29 | Disposition: A | Discharge status: Home / Self Care | Source: Ambulatory Visit | Attending: Family Medicine | Admitting: Family Medicine

## 2023-08-29 DIAGNOSIS — Z1231 Encounter for screening mammogram for malignant neoplasm of breast: Secondary | ICD-10-CM

## 2023-09-17 IMAGING — CT CT CARDIAC CORONARY ARTERY CALCIUM SCORE
3 series · 14 of 20 positions shown, 16 images · non-contrast
Comparison: None Available.
COMPARISON: None Available.

Addendum:
CLINICAL DATA: This over-read does not include interpretation of
cardiac or coronary anatomy or pathology. The coronary calcium score
interpretation by the cardiologist is attached.
CLINICAL DATA: Cardiovascular Disease Risk stratification

EXAM:
Coronary Calcium Score
TECHNIQUE: A gated, non-contrast computed tomography scan of the heart was
performed using 3mm slice thickness. Axial images were analyzed on a
dedicated workstation. Calcium scoring of the coronary arteries was
performed using the Agatston method.

[Series 2: cascseq 2.0 sa36 70% (id) · axial · 0.39mm/px · z∈[-227,-137]mm · 4 of 76 slices shown]
[im 16/76  vessel]
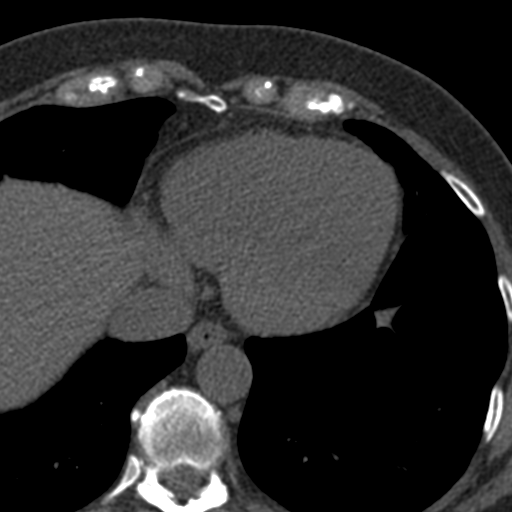
[im 31/76  vessel]
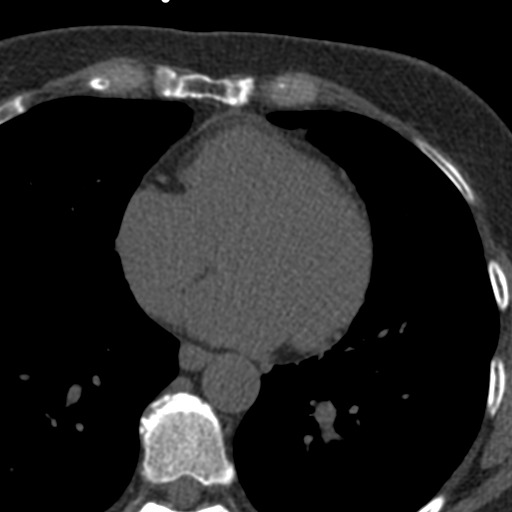
[im 46/76  vessel]
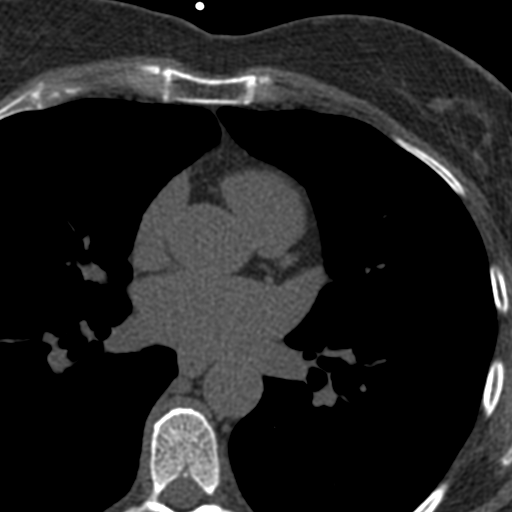
[im 61/76  vessel]
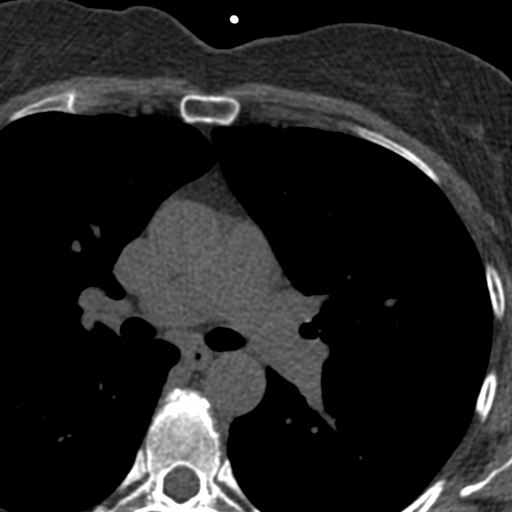

[Series 3: cascseq 2.0 bf37 st · axial · 0.64mm/px · z∈[-233,-133]mm · 5 of 76 slices shown, 7 images]
[im 13/76  vessel]
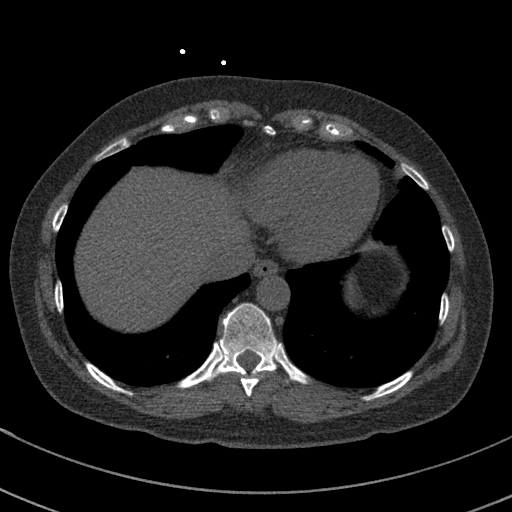
[im 13/76  lung]
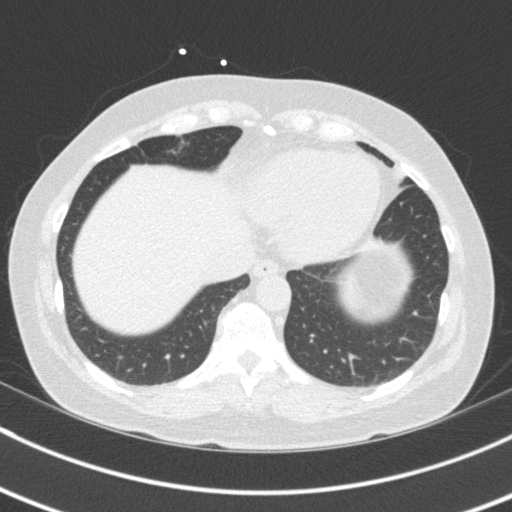
[im 26/76  vessel]
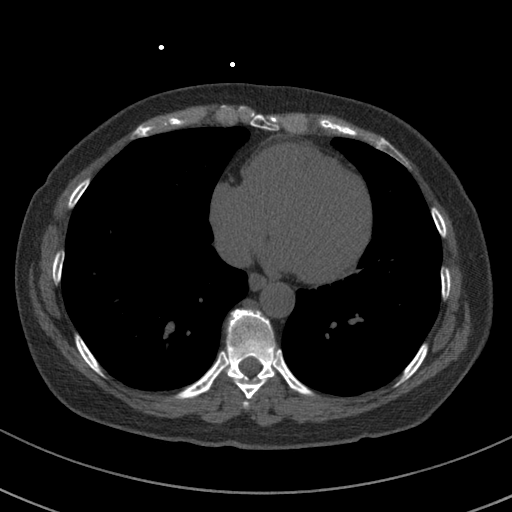
[im 38/76  vessel]
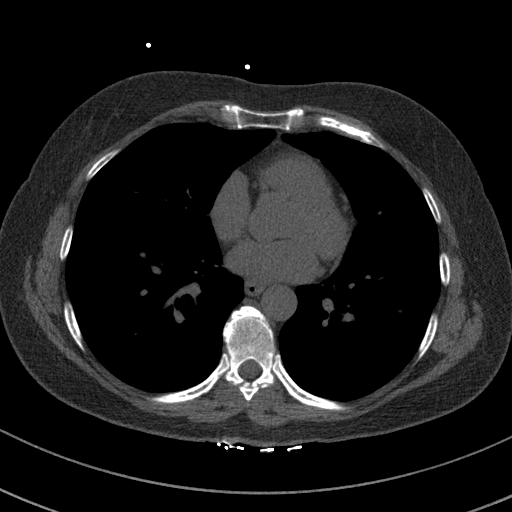
[im 51/76  vessel]
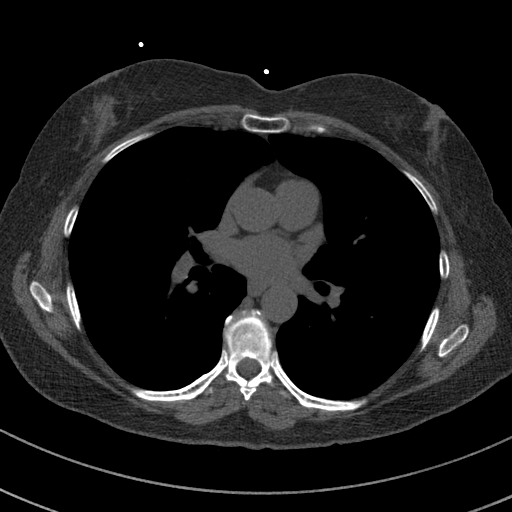
[im 63/76  vessel]
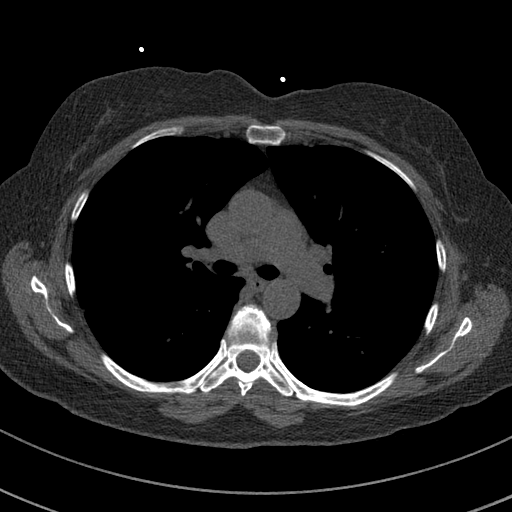
[im 63/76  lung]
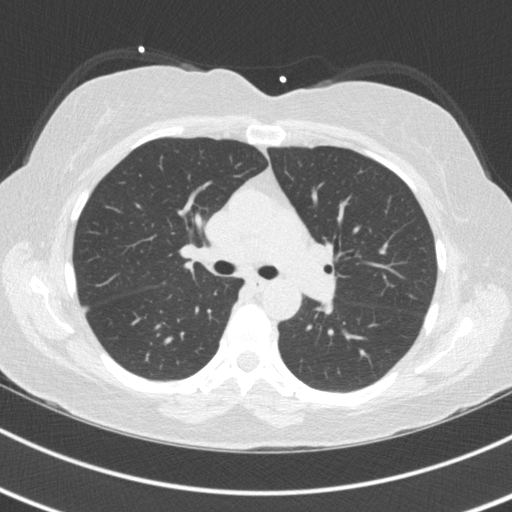

[Series 4: cascseq 2.0 br59 lung · axial · 0.64mm/px · z∈[-233,-133]mm · 5 of 76 slices shown]
[im 13/76  lung]
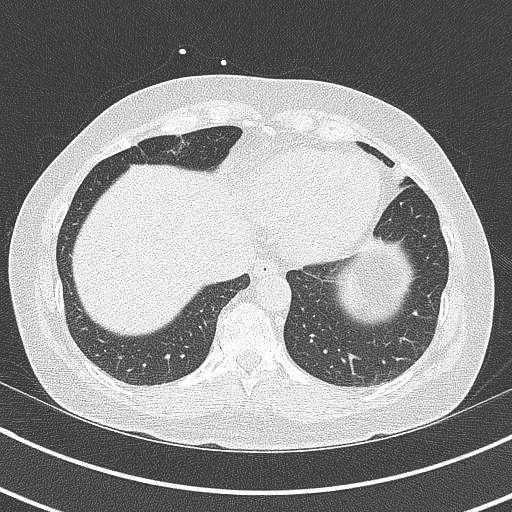
[im 26/76  lung]
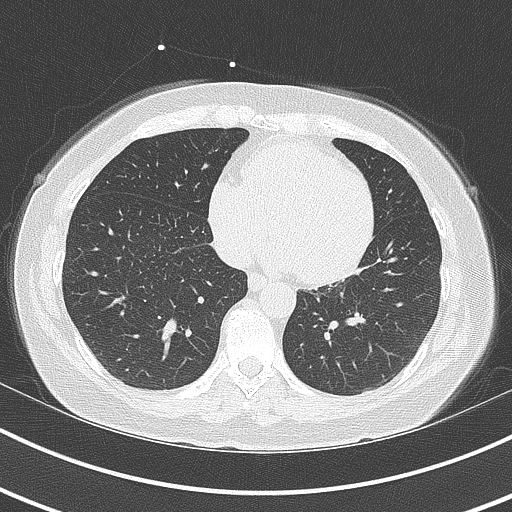
[im 38/76  lung]
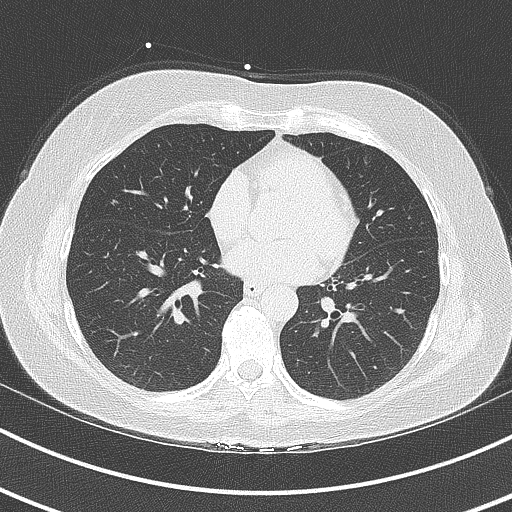
[im 51/76  lung]
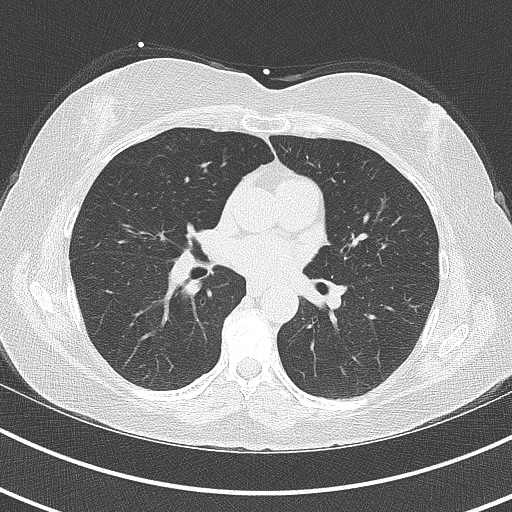
[im 63/76  lung]
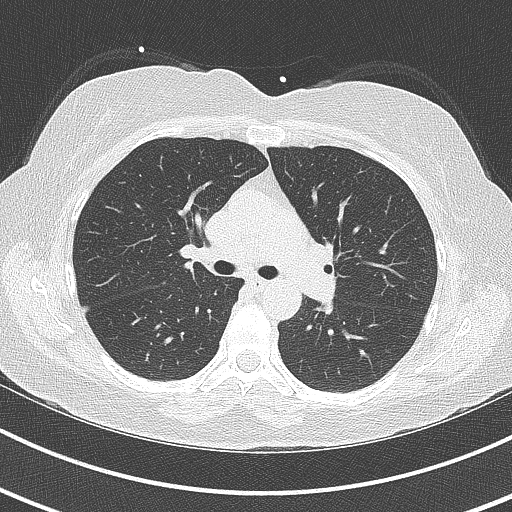

[14 of 20 positions shown; findings below may reference images not displayed]

FINDINGS: Within the visualized portions of the thorax there are no suspicious
appearing pulmonary nodules or masses, there is no acute
consolidative airspace disease, no pleural effusions, no
pneumothorax and no lymphadenopathy. Visualized portions of the
upper abdomen are unremarkable. There are no aggressive appearing
lytic or blastic lesions noted in the visualized portions of the
skeleton.
IMPRESSION: 1. No significant incidental noncardiac findings are noted.
FINDINGS: Coronary arteries: Normal origins.

Coronary Calcium Score:

Left main: 0

Left anterior descending artery: 0

Left circumflex artery: 0

Right coronary artery: 0

Total: 0

Percentile: 0

Pericardium: Normal.

Aorta: Normal caliber.

Non-cardiac: See separate report from [REDACTED].
IMPRESSION: Coronary calcium score of 0. This is a low risk study.



If CAC=0, it is reasonable to withhold statin therapy and reassess
in 5 to 10 years, as long as higher risk conditions are absent
(diabetes mellitus, family history of premature CHD in first degree
relatives (males <55 years; females <65 years), cigarette smoking,
or LDL >=190 mg/dL).

If CAC is 1 to 99, it is reasonable to initiate statin therapy for
patients >=55 years of age.

If CAC is >=100 or >=75th percentile, it is reasonable to initiate
statin therapy at any age.

Cardiology referral should be considered for patients with CAC
scores >=400 or >=75th percentile.

*5106 AHA/ACC/AACVPR/AAPA/ABC/LIMEN/NOMASIBULELE/KLPIGBB/[place]merid/GIPSZ/ILLARION/JONARDA
Guideline on the Management of Blood Cholesterol: A Report of the
American College of Cardiology/American Heart Association Task Force
on Clinical Practice Guidelines. J Am Coll Cardiol.
2939;73(24):1857-1397.

*** End of Addendum ***
FINDINGS: Within the visualized portions of the thorax there are no suspicious
appearing pulmonary nodules or masses, there is no acute
consolidative airspace disease, no pleural effusions, no
pneumothorax and no lymphadenopathy. Visualized portions of the
upper abdomen are unremarkable. There are no aggressive appearing
lytic or blastic lesions noted in the visualized portions of the
skeleton.
IMPRESSION: 1. No significant incidental noncardiac findings are noted.

## 2023-10-02 ENCOUNTER — Telehealth: Payer: Self-pay | Admitting: Family Medicine

## 2023-10-02 NOTE — Telephone Encounter (Signed)
 Placed in Dr. San box for review and signature.

## 2023-10-02 NOTE — Telephone Encounter (Signed)
 Patient's spouse dropped off document Surgical Clearance, to be filled out by provider. Patient requested to send it back via Call Patient to pick up within ASAP. Document is located in providers tray at front office.Please advise at Mobile [phone] (mobile)

## 2023-10-03 NOTE — Telephone Encounter (Signed)
 Called patient and made her aware paperwork completed and placed at front desk for pick up. Will be coming to pick it up 10/08/23.

## 2023-10-09 ENCOUNTER — Telehealth: Payer: Self-pay | Admitting: Family Medicine

## 2023-10-09 NOTE — Telephone Encounter (Signed)
 [facility] recent office notes faxed today, 10/09/2023.   Copied from CRM #[id number]. Topic: Clinical - Medical Advice >> Oct 09, 2023 11:23 AM Chasity T wrote: Reason for CRM: Dagoberto from [facility] has received a clearance form for surgery but did not receive the office notes needed which is the most recent ones she has faxed to them today.

## 2024-01-03 ENCOUNTER — Other Ambulatory Visit (HOSPITAL_BASED_OUTPATIENT_CLINIC_OR_DEPARTMENT_OTHER): Payer: Self-pay

## 2024-01-03 MED ORDER — FLUZONE 0.5 ML IM SUSY
0.5000 mL | PREFILLED_SYRINGE | Freq: Once | INTRAMUSCULAR | 0 refills | Status: AC
  Filled 2024-01-03: qty 0.5, 1d supply, fill #0

## 2024-07-23 ENCOUNTER — Encounter: Admitting: Family Medicine
